# Patient Record
Sex: Female | Born: 1950 | Race: White | Hispanic: No | Smoking: Never smoker
Health system: Southern US, Community
[De-identification: ages and names within clinical notes are randomized; demographics above are authoritative.]

## PROBLEM LIST (undated history)

## (undated) DIAGNOSIS — F32A Depression, unspecified: Secondary | ICD-10-CM

## (undated) DIAGNOSIS — I10 Essential (primary) hypertension: Secondary | ICD-10-CM

## (undated) DIAGNOSIS — M8580 Other specified disorders of bone density and structure, unspecified site: Secondary | ICD-10-CM

## (undated) DIAGNOSIS — F329 Major depressive disorder, single episode, unspecified: Secondary | ICD-10-CM

## (undated) DIAGNOSIS — F419 Anxiety disorder, unspecified: Secondary | ICD-10-CM

## (undated) DIAGNOSIS — E039 Hypothyroidism, unspecified: Secondary | ICD-10-CM

## (undated) HISTORY — PX: OTHER SURGICAL HISTORY: SHX169

## (undated) HISTORY — DX: Depression, unspecified: F32.A

## (undated) HISTORY — DX: Major depressive disorder, single episode, unspecified: F32.9

## (undated) HISTORY — PX: TEMPOROMANDIBULAR JOINT SURGERY: SHX35

## (undated) HISTORY — DX: Anxiety disorder, unspecified: F41.9

## (undated) HISTORY — DX: Essential (primary) hypertension: I10

## (undated) HISTORY — DX: Other specified disorders of bone density and structure, unspecified site: M85.80

## (undated) HISTORY — DX: Hypothyroidism, unspecified: E03.9

---

## 2008-02-13 HISTORY — PX: COLONOSCOPY: SHX174

## 2008-08-18 ENCOUNTER — Ambulatory Visit (HOSPITAL_COMMUNITY): Admission: RE | Admit: 2008-08-18 | Discharge: 2008-08-18 | Payer: Self-pay | Admitting: Family Medicine

## 2009-04-04 ENCOUNTER — Ambulatory Visit (HOSPITAL_COMMUNITY): Admission: RE | Admit: 2009-04-04 | Discharge: 2009-04-04 | Payer: Self-pay | Admitting: Family Medicine

## 2009-04-13 ENCOUNTER — Ambulatory Visit (HOSPITAL_COMMUNITY): Admission: RE | Admit: 2009-04-13 | Discharge: 2009-04-13 | Payer: Self-pay | Admitting: Family Medicine

## 2009-12-08 ENCOUNTER — Ambulatory Visit (HOSPITAL_COMMUNITY): Admission: RE | Admit: 2009-12-08 | Discharge: 2009-12-08 | Payer: Self-pay | Admitting: Family Medicine

## 2009-12-08 ENCOUNTER — Encounter: Payer: Self-pay | Admitting: Orthopedic Surgery

## 2009-12-14 ENCOUNTER — Ambulatory Visit: Payer: Self-pay | Admitting: Orthopedic Surgery

## 2009-12-14 DIAGNOSIS — M20019 Mallet finger of unspecified finger(s): Secondary | ICD-10-CM | POA: Insufficient documentation

## 2009-12-19 ENCOUNTER — Encounter (HOSPITAL_COMMUNITY)
Admission: RE | Admit: 2009-12-19 | Discharge: 2010-01-18 | Payer: Self-pay | Source: Home / Self Care | Admitting: Orthopedic Surgery

## 2010-01-11 ENCOUNTER — Encounter: Payer: Self-pay | Admitting: Orthopedic Surgery

## 2010-01-20 ENCOUNTER — Ambulatory Visit (HOSPITAL_COMMUNITY)
Admission: RE | Admit: 2010-01-20 | Discharge: 2010-01-20 | Payer: Self-pay | Source: Home / Self Care | Attending: Family Medicine | Admitting: Family Medicine

## 2010-01-25 ENCOUNTER — Ambulatory Visit: Payer: Self-pay | Admitting: Orthopedic Surgery

## 2010-02-01 ENCOUNTER — Ambulatory Visit: Payer: Self-pay | Admitting: Orthopedic Surgery

## 2010-02-23 ENCOUNTER — Encounter (HOSPITAL_COMMUNITY)
Admission: RE | Admit: 2010-02-23 | Discharge: 2010-03-14 | Payer: Self-pay | Source: Home / Self Care | Attending: Orthopedic Surgery | Admitting: Orthopedic Surgery

## 2010-03-06 ENCOUNTER — Encounter: Payer: Self-pay | Admitting: Family Medicine

## 2010-03-08 ENCOUNTER — Ambulatory Visit: Admit: 2010-03-08 | Payer: Self-pay | Admitting: Orthopedic Surgery

## 2010-03-14 NOTE — Letter (Signed)
Summary: History Form  History Form   Imported By: Jacklynn Ganong 12/19/2009 11:16:04  _____________________________________________________________________  External Attachment:    Type:   Image     Comment:   External Document

## 2010-03-14 NOTE — Assessment & Plan Note (Signed)
Summary: EVAL/TREAT FX RT PINKY FINGER/XRAY RDC/REF DR MCGOUGH/AETNA/CAF   Visit Type:  Initial Consult Referring Provider:  Dr. Regino Schultze  CC:  right little finger fracture.  History of Present Illness: I saw Lori Meadows in the office today for an initial visit.  She is a 60 years old woman with the complaint of:  right little finger fracture.  DOI ???  Xrays at [facility] on 12-08-09.  Medications: Synthroid, Fosamax, Zanax.  This is a 60 year old female, who injured her small RIGHT small finger in a tail gating, accident and self treated with a splint for 2 weeks, then she took the splint off. She had a drop finger at the DIP joint. She saw her primary care physician complaining of inability to extend the finger and dull pain, which was 3/10 associated with swelling.    Allergies (verified): 1)  ! * Lexapro  Past History:  Past Medical History: Glaucoma Anxiety Depression Hypothyroid Osteopenia  Review of Systems Constitutional:  Complains of fatigue; denies weight loss, weight gain, fever, and chills. Cardiovascular:  Complains of palpitations; denies chest pain, fainting, and murmurs. Respiratory:  Complains of wheezing; denies short of breath, couch, tightness, pain on inspiration, and snoring . Gastrointestinal:  Complains of heartburn; denies nausea, vomiting, diarrhea, constipation, and blood in your stools. Genitourinary:  Complains of frequency; denies urgency, difficulty urinating, painful urination, flank pain, and bleeding in urine. Neurologic:  Complains of numbness; denies tingling, unsteady gait, dizziness, tremors, and seizure. Musculoskeletal:  Complains of joint pain; denies swelling, instability, stiffness, redness, heat, and muscle pain. Endocrine:  Complains of heat or cold intolerance; denies excessive thirst and exessive urination. Psychiatric:  Complains of nervousness, depression, and anxiety; denies hallucinations. Skin:  Denies changes in the skin,  poor healing, rash, itching, and redness. HEENT:  Denies blurred or double vision, eye pain, redness, and watering. Immunology:  Denies seasonal allergies, sinus problems, and allergic to bee stings. Hemoatologic:  Complains of brusing; denies easy bleeding.  Physical Exam  Skin:  intact without lesions or rashes Psych:  alert and cooperative; normal mood and affect; normal attention span and concentration   Wrist/Hand Exam  General:    Well-developed, well-nourished, in no acute distress; alert and oriented x 3.    Skin:    Intact with no erythema; no scarring.    Inspection:    swelling of the DIP joint of the RIGHT hand with tenderness and a little redness. Some tenderness and pain at the PIP joint as well  Palpation:    as noted  Vascular:    normal capillary refill in the digit  Sensory:    normal sensation in the digit  Motor:    extension weakness at the DIP joint  Reflexes:    not tested  Hand Exam:    Right:    Inspection:  Abnormal    Palpation:  Abnormal    Tenderness:  5th DIPJ    Swelling:  5th DIPJ    loss of extension of the DIP joint   Impression & Recommendations:  Problem # 1:  MALLET FINGER (ICD-736.1) Assessment New x-rays are obtained from the hospital via the computer. They show a small avulsion fracture at the DIP joint and involving the proximal phalanx with a mallet deformity.  Recommend hand in rehabilitation mallet finger protocol with splinting for 6 weeks full time and then 4 weeks part-time Orders: Physical Therapy Referral (PT) New Patient Level II (16109)  Patient Instructions: 1)  come back in 6 weeks for  recheck on mallet finger with xrays. 2)  Please call [facility] to get you appt for making of the splint for your finger.   Orders Added: 1)  Physical Therapy Referral [PT] 2)  New Patient Level II [13244]

## 2010-03-14 NOTE — Miscellaneous (Signed)
Summary: OT Clinical evaluation  OT Clinical evaluation   Imported By: Jacklynn Ganong 01/12/2010 08:43:45  _____________________________________________________________________  External Attachment:    Type:   Image     Comment:   External Document

## 2010-03-16 NOTE — Assessment & Plan Note (Signed)
Summary: 6 WK RECK/XR RT PINKY FINGER/AETNE/BSF   Visit Type:  Follow-up Referring Provider:  Dr. Regino Schultze  CC:  fx care right pinky.  History of Present Illness:  Medications: Synthroid, Fosamax, Zanax.  This is a 60 year old female, who injured her small RIGHT small finger in a tail gating, accident and self treated with a splint for 2 weeks, then she took the splint off. She had a drop finger at the DIP joint. She saw her primary care physician complaining of inability to extend the finger and dull pain, which was 3/10 associated with swelling.  Today is 6 week recheck with xrays after splinting   [facility] made the splint   Followup treatment after delayed presentation for mallet finger RIGHT small finger  Followup x-rays after 6 weeks of splinting show that the fracture has healed  Clinically with the splint off the finger is in extension against gravity  Recommend weaning process with 4 weeks of nighttime.  weaning process during the day         Allergies: 1)  ! * Lexapro   Impression & Recommendations:  Problem # 1:  MALLET FINGER (ICD-736.1) [facility] lateral and oblique x-rays RIGHT small finger show the fracture has healed in extension.  Impression healed fracture distal phalanx there Orders: Occupational Therapy (OT) Est. Patient Level II (04540) Finger(s) x-ray, minimum 2 views (98119)  Patient Instructions: 1)  OT  2)  for weaning program for mallet finger  3)  continue splint 4)  come back in 4 weeks   Orders Added: 1)  Occupational Therapy [OT] 2)  Est. Patient Level II [14782] 3)  Finger(s) x-ray, minimum 2 views [73140]

## 2010-03-21 ENCOUNTER — Encounter: Payer: Self-pay | Admitting: Orthopedic Surgery

## 2010-03-30 NOTE — Miscellaneous (Signed)
Summary: OT clinical eval  OT clinical eval   Imported By: Jacklynn Ganong 03/23/2010 10:05:39  _____________________________________________________________________  External Attachment:    Type:   Image     Comment:   External Document

## 2010-04-12 ENCOUNTER — Encounter: Payer: Self-pay | Admitting: Orthopedic Surgery

## 2010-04-25 NOTE — Miscellaneous (Signed)
Summary: Rehab no show  Rehab no show   Imported By: Jacklynn Ganong 04/19/2010 11:28:14  _____________________________________________________________________  External Attachment:    Type:   Image     Comment:   External Document

## 2010-10-26 ENCOUNTER — Other Ambulatory Visit (HOSPITAL_COMMUNITY): Payer: Self-pay | Admitting: Family Medicine

## 2010-10-26 DIAGNOSIS — Z139 Encounter for screening, unspecified: Secondary | ICD-10-CM

## 2010-10-31 ENCOUNTER — Ambulatory Visit (HOSPITAL_COMMUNITY): Payer: Managed Care, Other (non HMO)

## 2011-02-08 ENCOUNTER — Encounter: Payer: Self-pay | Admitting: General Practice

## 2011-02-19 ENCOUNTER — Ambulatory Visit: Payer: Managed Care, Other (non HMO) | Admitting: Urgent Care

## 2011-02-20 ENCOUNTER — Ambulatory Visit: Payer: Managed Care, Other (non HMO) | Admitting: Urgent Care

## 2011-02-28 ENCOUNTER — Ambulatory Visit (INDEPENDENT_AMBULATORY_CARE_PROVIDER_SITE_OTHER): Payer: Managed Care, Other (non HMO) | Admitting: Gastroenterology

## 2011-02-28 ENCOUNTER — Encounter: Payer: Self-pay | Admitting: Gastroenterology

## 2011-02-28 VITALS — BP 110/60 | HR 75 | Temp 98.8°F | Ht 65.0 in | Wt 150.8 lb

## 2011-02-28 DIAGNOSIS — R1314 Dysphagia, pharyngoesophageal phase: Secondary | ICD-10-CM

## 2011-02-28 DIAGNOSIS — R1310 Dysphagia, unspecified: Secondary | ICD-10-CM | POA: Insufficient documentation

## 2011-02-28 DIAGNOSIS — R1319 Other dysphagia: Secondary | ICD-10-CM | POA: Insufficient documentation

## 2011-02-28 NOTE — Progress Notes (Signed)
Faxed to PCP

## 2011-02-28 NOTE — Progress Notes (Signed)
Primary Care Physician:  Kirk Ruths, MD, MD  Primary Gastroenterologist:  Roetta Sessions, MD   Chief Complaint  Patient presents with  . Dysphagia    feels like she is choking    HPI:  Lori Meadows is a 61 y.o. female here for further evaluation of sensation of choking.  Choking problem since 09/2010. Feels like choking even without meal. Feels like pressure in chest. Pain there most of the time. Seems to be getting worse. Hard to get food or liquid down. Rare heartburn. No vomiting. No abdominal pain. BM regular. No melena, brbpr. Cannot eat anything dry, hard as it hurts and will not go down. Cut out meat. Eating soft foods mostly.   Thyroid medicine increased today.  Current Outpatient Prescriptions  Medication Sig Dispense Refill  . alendronate (FOSAMAX) 70 MG tablet Take 70 mg by mouth every 7 (seven) days. Take with a full glass of water on an empty stomach.      Marland Kitchen alprazolam (XANAX) 2 MG tablet Take 2 mg by mouth at bedtime as needed.      . latanoprost (XALATAN) 0.005 % ophthalmic solution Place 1 drop into both eyes at bedtime.      Marland Kitchen levothyroxine (SYNTHROID, LEVOTHROID) 25 MCG tablet Take 50 mcg by mouth daily.       Marland Kitchen lisinopril (PRINIVIL,ZESTRIL) 5 MG tablet Take 5 mg by mouth daily.        Allergies as of 02/28/2011 - Review Complete 02/28/2011  Allergen Reaction Noted  . Escitalopram oxalate      Past Medical History  Diagnosis Date  . Glaucoma   . Anxiety   . Depression   . Hypothyroid   . Osteopenia   . HTN (hypertension)   . Vitamin D deficiency     Past Surgical History  Procedure Date  . None   . Temporomandibular joint surgery     ???  . Colonoscopy 2010    Dr. Matthias Hughs, no polpys, next one done in 2020    Family History  Problem Relation Age of Onset  . Colon cancer Neg Hx   . Glaucoma Mother   . COPD Father     History   Social History  . Marital Status: Divorced    Spouse Name: N/A    Number of Children: 3  . Years of  Education: N/A   Occupational History  . retired     [employer]   Social History Main Topics  . Smoking status: Never Smoker   . Smokeless tobacco: Not on file  . Alcohol Use: No  . Drug Use: No  . Sexually Active: Not on file   Other Topics Concern  . Not on file   Social History Narrative  . No narrative on file      ROS:  General: Negative for anorexia, weight loss, fever, chills, fatigue, weakness. Eyes: Negative for vision changes.  ENT: Negative for hoarseness, nasal congestion.see history of present illness. CV: Negative for chest pain, angina, palpitations, dyspnea on exertion, peripheral edema.  Respiratory: Negative for dyspnea at rest, dyspnea on exertion, cough, sputum, wheezing.  GI: See history of present illness. GU:  Negative for dysuria, hematuria, urinary incontinence, urinary frequency, nocturnal urination.  MS: Negative for joint pain, low back pain.  Derm: Negative for rash or itching.  Neuro: Negative for weakness, abnormal sensation, seizure, frequent headaches, memory loss, confusion.  Psych: Negative for anxiety, depression, suicidal ideation, hallucinations.  Endo: Negative for unusual weight change.  Heme:  Negative for bruising or bleeding. Allergy: Negative for rash or hives.    Physical Examination:  BP 110/60  Pulse 75  Temp(Src) 98.8 F (37.1 C) (Temporal)  Ht 5\' 5"  (1.651 m)  Wt 150 lb 12.8 oz (68.402 kg)  BMI 25.09 kg/m2   General: Well-nourished, well-developed in no acute distress.  Head: Normocephalic, atraumatic.   Eyes: Conjunctiva pink, no icterus. Mouth: Oropharyngeal mucosa moist and pink , no lesions erythema or exudate. Neck: Supple without thyromegaly, masses, or lymphadenopathy.  Lungs: Clear to auscultation bilaterally.  Heart: Regular rate and rhythm, no murmurs rubs or gallops.  Abdomen: Bowel sounds are normal, nontender, nondistended, no hepatosplenomegaly or masses, no abdominal bruits or     hernia , no rebound or guarding.   Rectal: Not performed. Extremities: No lower extremity edema. No clubbing or deformities.  Neuro: Alert and oriented x 4 , grossly normal neurologically.  Skin: Warm and dry, no rash or jaundice.   Psych: Alert and cooperative, normal mood and affect.

## 2011-02-28 NOTE — Assessment & Plan Note (Signed)
Vague, kind of unusual, symptoms. Feels like something in her chest all the time for the past few months, gradually getting worse. However also having problems at times with solid foods such as bread, meat. She has adjusted her diet to a soft diet. Denies problem getting her pills down. Very careful with taking her Fosamax. Denies typical heartburn. I discussed with her today options of upper endoscopy. She is very concerned about not having a ride home although we did reassure her that we could arrange for transportation. The patient desires barium pill esophagram as first step. If negative, would consider CXR as next step to rule out extrinsic compression on the esophagus.

## 2011-02-28 NOTE — Patient Instructions (Signed)
We have scheduled you for a barium esophagram. We will call you with results.   Dysphagia Swallowing problems (dysphagia) occur when solids and liquids seem to stick in your throat on the way down to your stomach, or the food takes longer to get to the stomach. Other symptoms (problems) include regurgitating (burping) up food, noises coming from the throat, chest discomfort with swallowing, and a feeling of fullness in the throat when swallowing. When blockage in the throat is complete it may be associated with drooling. CAUSES There are many causes of swallowing difficulties and the following is generalized information regarding a number of reasons for this problem. Problems with swallowing may occur because of problems with the muscles. The food cannot be propelled in the usual manner into the stomach. There may be ulcers, scar tissueor inflammation (soreness) in the esophagus (the food tube from the mouth to the stomach) which blocks food from passing normally into the stomach. Causes of inflammation include acid reflux from the stomach into the esophagus. Inflammation can also be caused by the herpes simplex virus, Candida (yeast), radiation (as with treatment of cancer), or inflammation from medications not taken with adequate fluids to wash them down into the stomach. There may be nerve problems so signals cannot be sent adequately telling the muscles of the esophagus to contract and move the food along. Achalasia is a rare disorder of the esophagus in which muscular contractions of the esophagus are uncoordinated. Globus hystericus is a relatively common problem in young females in which there is a sense of an obstruction or difficulty in swallowing, but in which no abnormalities can be found. This problem usually improves over time with reassurance and testing to rule out other causes. DIAGNOSIS A number of tests will help your caregiver know what is the cause of your swallowing problems. These tests  may include a barium swallow in which x-rays are taken while you are drinking a liquid that outlines the lining of the esophagus on x-ray. If the stomach and small bowel are also studied in this manner it is called an upper gastrointestinal exam (UGI). Endoscopy may be done in which your caregiver examines your throat, esophagus, stomach and small bowel with an instrument like a small flexible telescope. Motility studies which measure the effectiveness and coordination of the muscular contractions of the esophagus may also be done. TREATMENT The treatment of swallowing problems are many, varying from medications to surgical treatment. The treatment varies with the type of problem found. Your caregiver will discuss your results and treatment with you. If swallowing problems are severe the long term problems which may occur include: malnutrition, pneumonia (from food going into the breathing tubes called trachea and bronchi), and an increase in tumors (lumps) of the esophagus. SEEK IMMEDIATE MEDICAL CARE IF:  Food or other object becomes lodged in your throat or esophagus and won't move.  Document Released: 01/27/2000 Document Revised: 10/11/2010 Document Reviewed: 09/17/2007 [facility] Patient Information 2012 [place], [place].

## 2011-03-05 ENCOUNTER — Telehealth: Payer: Self-pay | Admitting: Gastroenterology

## 2011-03-05 NOTE — Telephone Encounter (Signed)
Pt called back requesting we cancel her BPE- she stated that she did not want to have anything to do with [facility] - and would not have any procedure preformed there or by anyone associated with them- I tried to address her concerns but she insisted that she was going to go back to her PCP and be referred to a GI Dr in [facility] or [employer]- I told her to call us back as needed.

## 2011-03-06 NOTE — Telephone Encounter (Signed)
BPE Cancelled- Spoke w/ Misty Stanley in Radiology

## 2011-03-06 NOTE — Telephone Encounter (Signed)
Her choice.  

## 2011-03-06 NOTE — Telephone Encounter (Signed)
Is there any reason she has abruptly changed her attitude?

## 2011-03-06 NOTE — Telephone Encounter (Signed)
Per Cherene Julian, the patient stated she has heard bad things about [facility] and just doesn't want to be associated with any physician that practices at [facility].

## 2011-03-07 NOTE — Progress Notes (Signed)
REVIEWED.  

## 2011-03-08 ENCOUNTER — Ambulatory Visit (HOSPITAL_COMMUNITY): Payer: Managed Care, Other (non HMO)

## 2011-03-13 ENCOUNTER — Ambulatory Visit
Admission: RE | Admit: 2011-03-13 | Discharge: 2011-03-13 | Disposition: A | Discharge status: Home / Self Care | Payer: Managed Care, Other (non HMO) | Source: Ambulatory Visit | Attending: Otolaryngology | Admitting: Otolaryngology

## 2011-03-13 ENCOUNTER — Other Ambulatory Visit: Payer: Self-pay | Admitting: Otolaryngology

## 2011-04-30 IMAGING — CR DG CERVICAL SPINE COMPLETE 4+V
5 series · 5 of 5 positions shown · non-contrast
Comparison: None

CLINICAL DATA: Chronic neck pain, crepitus

CERVICAL SPINE - COMPLETE 4+ VIEW

[view not recorded (1 of 5)]
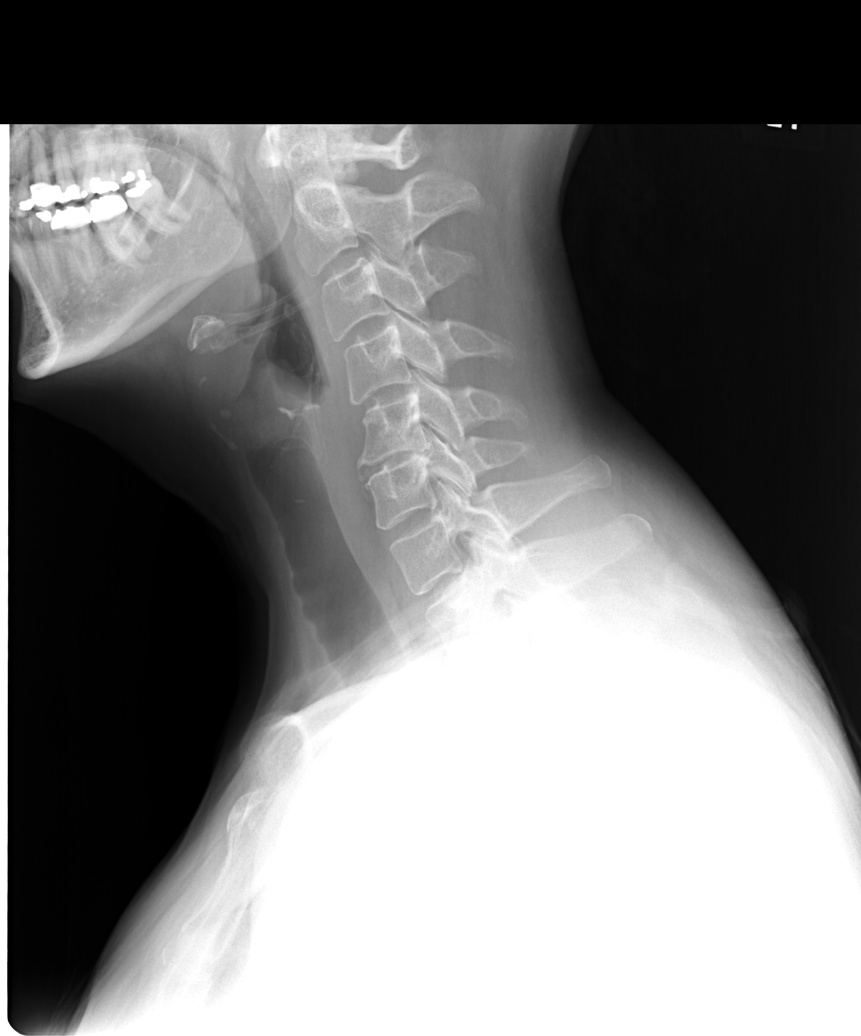

[view not recorded (2 of 5)]
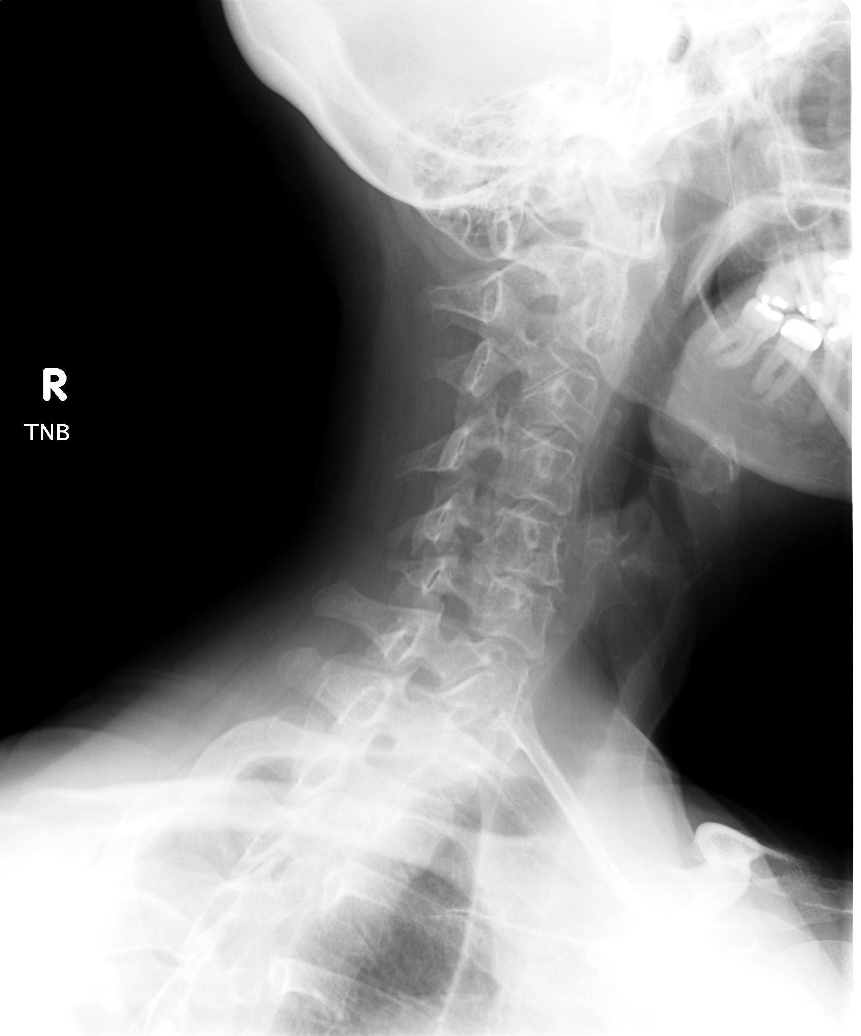

[view not recorded (3 of 5)]
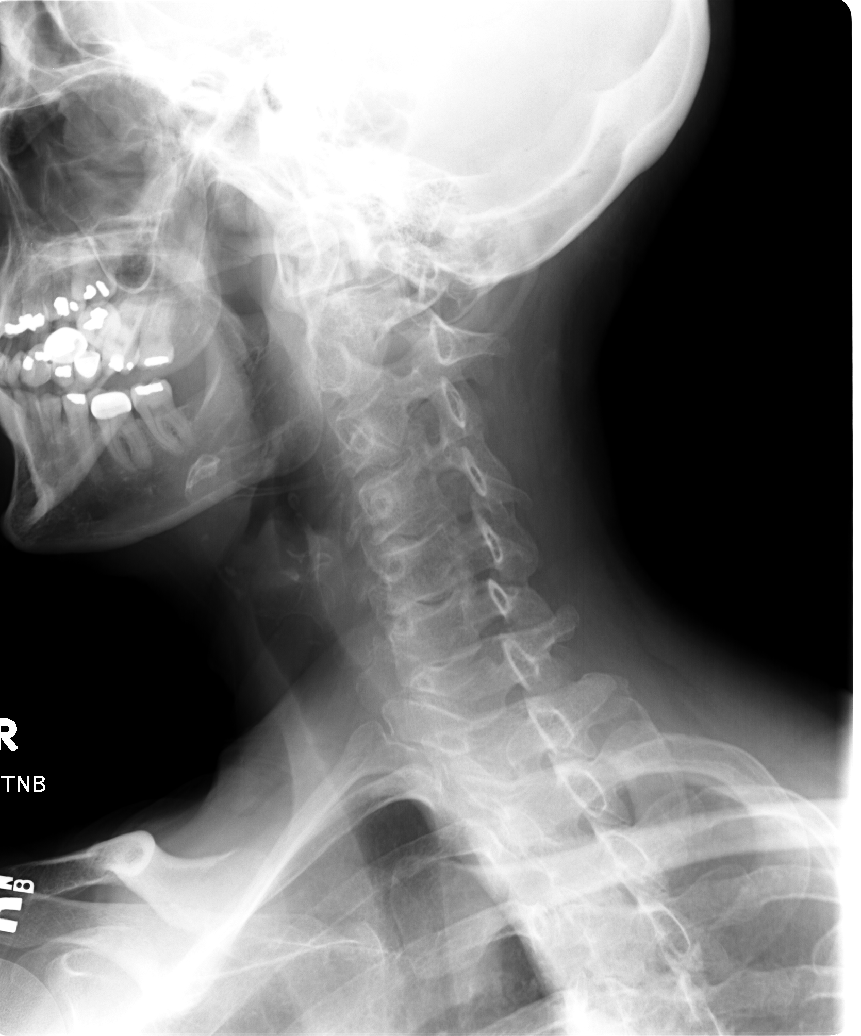

[view not recorded (4 of 5)]
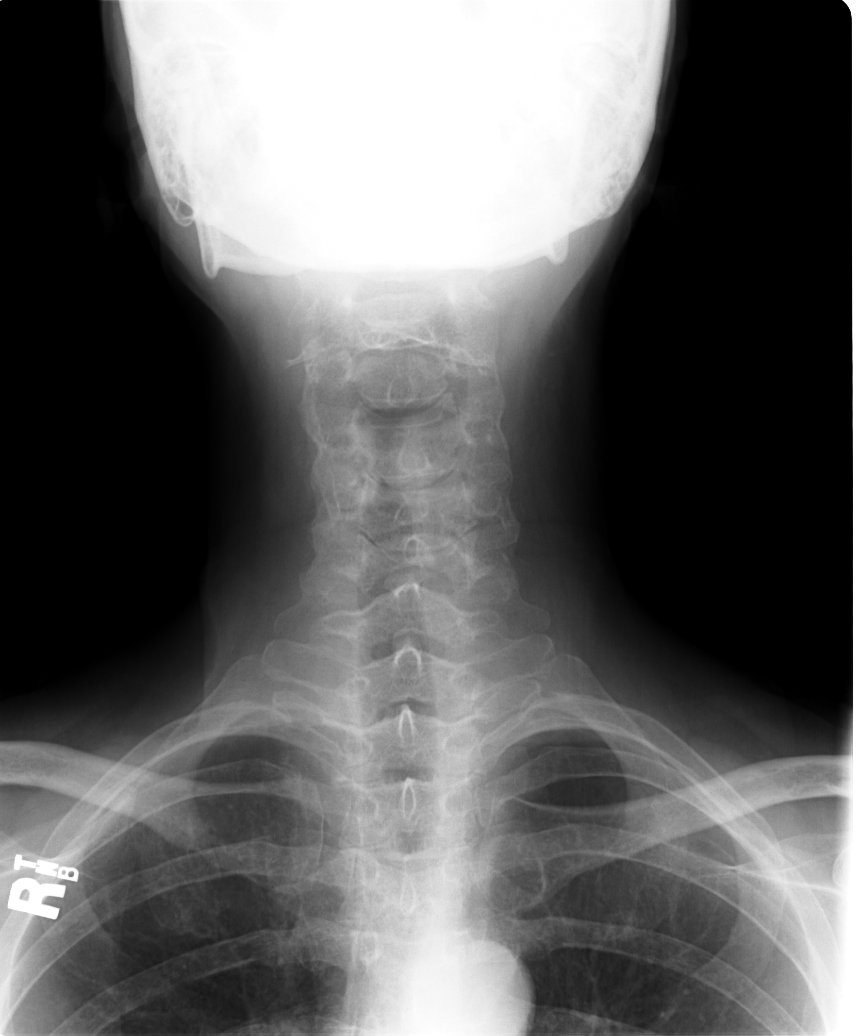

[view not recorded (5 of 5)]
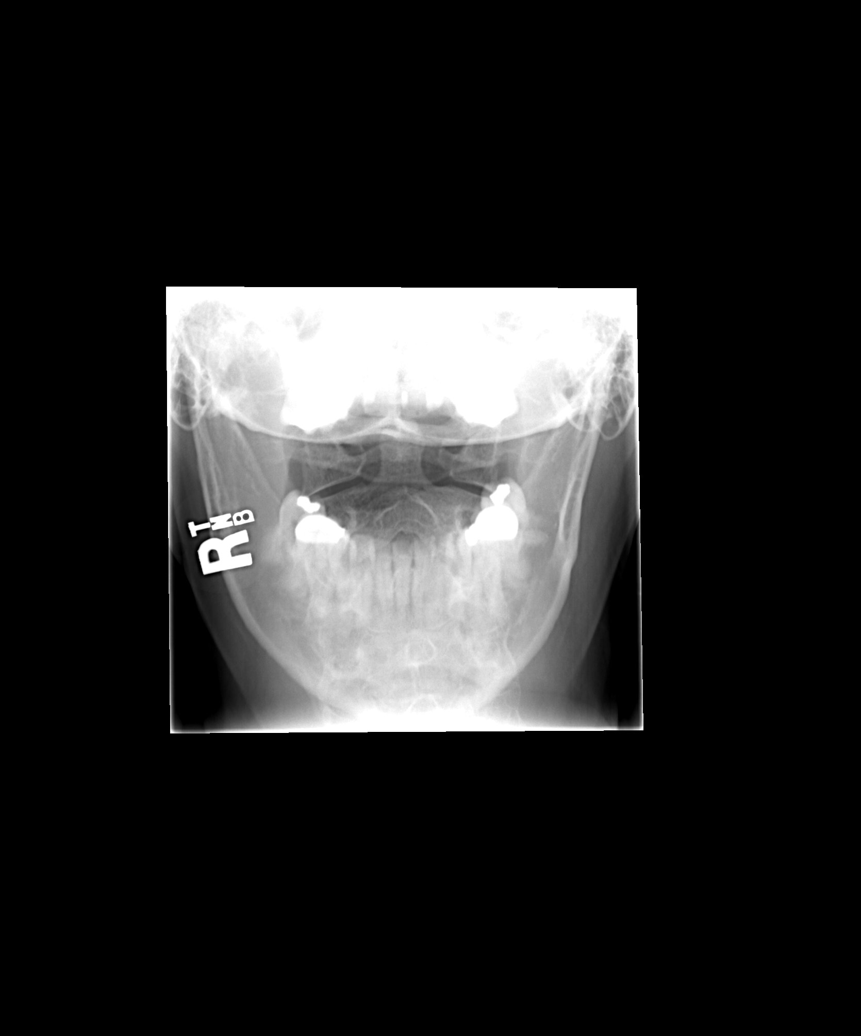

[5 of 5 positions shown; findings below may reference images not displayed]

FINDINGS: Diffuse bony demineralization.
Disc space narrowing C5-C6 with small anterior posterior spurs.
Minimal retrolisthesis C5-C6.
Reversal of cervical lordosis mid cervical spine.
Prevertebral soft tissues normal thickness.
Vertebral body heights maintained without fracture or additional
subluxation.
Slight encroachment upon right C5-C6 neural foramen by combination
of facet and uncovertebral hypertrophy.
Left neural foramen C5-C6 less narrowed.
C1-C2 alignment normal.
IMPRESSION: Degenerative disc disease changes of the cervical spine at C5-C6 as
above.
No acute bony abnormality.

## 2012-05-20 ENCOUNTER — Other Ambulatory Visit (HOSPITAL_COMMUNITY): Payer: Self-pay | Admitting: Family Medicine

## 2012-05-20 DIAGNOSIS — M810 Age-related osteoporosis without current pathological fracture: Secondary | ICD-10-CM

## 2012-05-22 ENCOUNTER — Ambulatory Visit (HOSPITAL_COMMUNITY)
Admission: RE | Admit: 2012-05-22 | Discharge: 2012-05-22 | Disposition: A | Discharge status: Home / Self Care | Payer: Medicare Other | Source: Ambulatory Visit | Attending: Family Medicine | Admitting: Family Medicine

## 2012-05-22 DIAGNOSIS — M810 Age-related osteoporosis without current pathological fracture: Secondary | ICD-10-CM

## 2012-05-22 DIAGNOSIS — M899 Disorder of bone, unspecified: Secondary | ICD-10-CM | POA: Insufficient documentation

## 2012-05-22 DIAGNOSIS — M949 Disorder of cartilage, unspecified: Secondary | ICD-10-CM | POA: Insufficient documentation

## 2015-06-17 ENCOUNTER — Other Ambulatory Visit (HOSPITAL_COMMUNITY): Payer: Self-pay | Admitting: Registered Nurse

## 2015-06-17 DIAGNOSIS — M810 Age-related osteoporosis without current pathological fracture: Secondary | ICD-10-CM

## 2015-06-24 ENCOUNTER — Ambulatory Visit (HOSPITAL_COMMUNITY)
Admission: RE | Admit: 2015-06-24 | Discharge: 2015-06-24 | Disposition: A | Discharge status: Home / Self Care | Payer: Commercial Managed Care - HMO | Source: Ambulatory Visit | Attending: Registered Nurse | Admitting: Registered Nurse

## 2015-06-24 DIAGNOSIS — M810 Age-related osteoporosis without current pathological fracture: Secondary | ICD-10-CM | POA: Diagnosis not present

## 2015-06-24 DIAGNOSIS — M85851 Other specified disorders of bone density and structure, right thigh: Secondary | ICD-10-CM | POA: Insufficient documentation

## 2015-06-24 DIAGNOSIS — M8588 Other specified disorders of bone density and structure, other site: Secondary | ICD-10-CM | POA: Diagnosis not present

## 2015-06-27 DIAGNOSIS — F419 Anxiety disorder, unspecified: Secondary | ICD-10-CM | POA: Diagnosis not present

## 2015-06-27 DIAGNOSIS — Z6823 Body mass index (BMI) 23.0-23.9, adult: Secondary | ICD-10-CM | POA: Diagnosis not present

## 2015-06-27 DIAGNOSIS — R5383 Other fatigue: Secondary | ICD-10-CM | POA: Diagnosis not present

## 2015-06-27 DIAGNOSIS — I10 Essential (primary) hypertension: Secondary | ICD-10-CM | POA: Diagnosis not present

## 2015-06-27 DIAGNOSIS — G894 Chronic pain syndrome: Secondary | ICD-10-CM | POA: Diagnosis not present

## 2015-06-27 DIAGNOSIS — Z1389 Encounter for screening for other disorder: Secondary | ICD-10-CM | POA: Diagnosis not present

## 2015-07-19 DIAGNOSIS — H2512 Age-related nuclear cataract, left eye: Secondary | ICD-10-CM | POA: Diagnosis not present

## 2015-07-19 DIAGNOSIS — H409 Unspecified glaucoma: Secondary | ICD-10-CM | POA: Diagnosis not present

## 2015-07-19 DIAGNOSIS — H2511 Age-related nuclear cataract, right eye: Secondary | ICD-10-CM | POA: Diagnosis not present

## 2015-07-19 DIAGNOSIS — H02839 Dermatochalasis of unspecified eye, unspecified eyelid: Secondary | ICD-10-CM | POA: Diagnosis not present

## 2015-09-27 DIAGNOSIS — Z23 Encounter for immunization: Secondary | ICD-10-CM | POA: Diagnosis not present

## 2015-09-27 DIAGNOSIS — G894 Chronic pain syndrome: Secondary | ICD-10-CM | POA: Diagnosis not present

## 2015-09-27 DIAGNOSIS — F419 Anxiety disorder, unspecified: Secondary | ICD-10-CM | POA: Diagnosis not present

## 2015-09-27 DIAGNOSIS — E063 Autoimmune thyroiditis: Secondary | ICD-10-CM | POA: Diagnosis not present

## 2015-09-27 DIAGNOSIS — Z6825 Body mass index (BMI) 25.0-25.9, adult: Secondary | ICD-10-CM | POA: Diagnosis not present

## 2015-10-06 DIAGNOSIS — H40003 Preglaucoma, unspecified, bilateral: Secondary | ICD-10-CM | POA: Diagnosis not present

## 2015-10-06 DIAGNOSIS — H409 Unspecified glaucoma: Secondary | ICD-10-CM | POA: Diagnosis not present

## 2015-10-06 DIAGNOSIS — H2513 Age-related nuclear cataract, bilateral: Secondary | ICD-10-CM | POA: Diagnosis not present

## 2015-10-06 DIAGNOSIS — H02839 Dermatochalasis of unspecified eye, unspecified eyelid: Secondary | ICD-10-CM | POA: Diagnosis not present

## 2016-03-08 DIAGNOSIS — H16223 Keratoconjunctivitis sicca, not specified as Sjogren's, bilateral: Secondary | ICD-10-CM | POA: Diagnosis not present

## 2016-03-08 DIAGNOSIS — H02839 Dermatochalasis of unspecified eye, unspecified eyelid: Secondary | ICD-10-CM | POA: Diagnosis not present

## 2016-03-08 DIAGNOSIS — H401131 Primary open-angle glaucoma, bilateral, mild stage: Secondary | ICD-10-CM | POA: Diagnosis not present

## 2016-03-30 DIAGNOSIS — F419 Anxiety disorder, unspecified: Secondary | ICD-10-CM | POA: Diagnosis not present

## 2016-03-30 DIAGNOSIS — E063 Autoimmune thyroiditis: Secondary | ICD-10-CM | POA: Diagnosis not present

## 2016-03-30 DIAGNOSIS — Z1389 Encounter for screening for other disorder: Secondary | ICD-10-CM | POA: Diagnosis not present

## 2016-03-30 DIAGNOSIS — E663 Overweight: Secondary | ICD-10-CM | POA: Diagnosis not present

## 2016-03-30 DIAGNOSIS — I10 Essential (primary) hypertension: Secondary | ICD-10-CM | POA: Diagnosis not present

## 2016-03-30 DIAGNOSIS — Z23 Encounter for immunization: Secondary | ICD-10-CM | POA: Diagnosis not present

## 2016-03-30 DIAGNOSIS — Z6826 Body mass index (BMI) 26.0-26.9, adult: Secondary | ICD-10-CM | POA: Diagnosis not present

## 2016-04-02 DIAGNOSIS — Z1231 Encounter for screening mammogram for malignant neoplasm of breast: Secondary | ICD-10-CM | POA: Diagnosis not present

## 2016-04-02 DIAGNOSIS — Z6825 Body mass index (BMI) 25.0-25.9, adult: Secondary | ICD-10-CM | POA: Diagnosis not present

## 2016-04-02 DIAGNOSIS — Z01419 Encounter for gynecological examination (general) (routine) without abnormal findings: Secondary | ICD-10-CM | POA: Diagnosis not present

## 2016-06-07 DIAGNOSIS — H43813 Vitreous degeneration, bilateral: Secondary | ICD-10-CM | POA: Diagnosis not present

## 2016-06-07 DIAGNOSIS — H2513 Age-related nuclear cataract, bilateral: Secondary | ICD-10-CM | POA: Diagnosis not present

## 2016-06-07 DIAGNOSIS — H401131 Primary open-angle glaucoma, bilateral, mild stage: Secondary | ICD-10-CM | POA: Diagnosis not present

## 2016-06-27 DIAGNOSIS — Z1389 Encounter for screening for other disorder: Secondary | ICD-10-CM | POA: Diagnosis not present

## 2016-06-27 DIAGNOSIS — I10 Essential (primary) hypertension: Secondary | ICD-10-CM | POA: Diagnosis not present

## 2016-06-27 DIAGNOSIS — Z6826 Body mass index (BMI) 26.0-26.9, adult: Secondary | ICD-10-CM | POA: Diagnosis not present

## 2016-06-27 DIAGNOSIS — E782 Mixed hyperlipidemia: Secondary | ICD-10-CM | POA: Diagnosis not present

## 2016-06-27 DIAGNOSIS — E039 Hypothyroidism, unspecified: Secondary | ICD-10-CM | POA: Diagnosis not present

## 2016-06-27 DIAGNOSIS — Z0000 Encounter for general adult medical examination without abnormal findings: Secondary | ICD-10-CM | POA: Diagnosis not present

## 2016-06-27 DIAGNOSIS — E063 Autoimmune thyroiditis: Secondary | ICD-10-CM | POA: Diagnosis not present

## 2016-07-26 DIAGNOSIS — M85851 Other specified disorders of bone density and structure, right thigh: Secondary | ICD-10-CM | POA: Diagnosis not present

## 2016-07-26 DIAGNOSIS — Z79899 Other long term (current) drug therapy: Secondary | ICD-10-CM | POA: Diagnosis not present

## 2016-07-26 DIAGNOSIS — M85852 Other specified disorders of bone density and structure, left thigh: Secondary | ICD-10-CM | POA: Diagnosis not present

## 2016-07-26 DIAGNOSIS — K219 Gastro-esophageal reflux disease without esophagitis: Secondary | ICD-10-CM | POA: Diagnosis not present

## 2016-07-26 DIAGNOSIS — Z780 Asymptomatic menopausal state: Secondary | ICD-10-CM | POA: Diagnosis not present

## 2016-07-26 DIAGNOSIS — E7800 Pure hypercholesterolemia, unspecified: Secondary | ICD-10-CM | POA: Diagnosis not present

## 2016-07-26 DIAGNOSIS — E039 Hypothyroidism, unspecified: Secondary | ICD-10-CM | POA: Diagnosis not present

## 2016-07-26 DIAGNOSIS — E2839 Other primary ovarian failure: Secondary | ICD-10-CM | POA: Diagnosis not present

## 2016-10-24 DIAGNOSIS — Z6826 Body mass index (BMI) 26.0-26.9, adult: Secondary | ICD-10-CM | POA: Diagnosis not present

## 2016-10-24 DIAGNOSIS — I10 Essential (primary) hypertension: Secondary | ICD-10-CM | POA: Diagnosis not present

## 2016-10-24 DIAGNOSIS — E063 Autoimmune thyroiditis: Secondary | ICD-10-CM | POA: Diagnosis not present

## 2016-10-24 DIAGNOSIS — E663 Overweight: Secondary | ICD-10-CM | POA: Diagnosis not present

## 2016-10-24 DIAGNOSIS — Z1389 Encounter for screening for other disorder: Secondary | ICD-10-CM | POA: Diagnosis not present

## 2016-10-24 DIAGNOSIS — F419 Anxiety disorder, unspecified: Secondary | ICD-10-CM | POA: Diagnosis not present

## 2016-10-24 DIAGNOSIS — B354 Tinea corporis: Secondary | ICD-10-CM | POA: Diagnosis not present

## 2016-12-31 DIAGNOSIS — H40013 Open angle with borderline findings, low risk, bilateral: Secondary | ICD-10-CM | POA: Diagnosis not present

## 2017-01-25 DIAGNOSIS — F419 Anxiety disorder, unspecified: Secondary | ICD-10-CM | POA: Diagnosis not present

## 2017-01-25 DIAGNOSIS — Z23 Encounter for immunization: Secondary | ICD-10-CM | POA: Diagnosis not present

## 2017-01-25 DIAGNOSIS — Z6828 Body mass index (BMI) 28.0-28.9, adult: Secondary | ICD-10-CM | POA: Diagnosis not present

## 2017-01-25 DIAGNOSIS — E663 Overweight: Secondary | ICD-10-CM | POA: Diagnosis not present

## 2017-03-28 DIAGNOSIS — C44519 Basal cell carcinoma of skin of other part of trunk: Secondary | ICD-10-CM | POA: Diagnosis not present

## 2017-03-28 DIAGNOSIS — L821 Other seborrheic keratosis: Secondary | ICD-10-CM | POA: Diagnosis not present

## 2017-03-28 DIAGNOSIS — L820 Inflamed seborrheic keratosis: Secondary | ICD-10-CM | POA: Diagnosis not present

## 2017-03-28 DIAGNOSIS — L570 Actinic keratosis: Secondary | ICD-10-CM | POA: Diagnosis not present

## 2017-03-28 DIAGNOSIS — D225 Melanocytic nevi of trunk: Secondary | ICD-10-CM | POA: Diagnosis not present

## 2017-03-28 DIAGNOSIS — D485 Neoplasm of uncertain behavior of skin: Secondary | ICD-10-CM | POA: Diagnosis not present

## 2017-03-28 DIAGNOSIS — L918 Other hypertrophic disorders of the skin: Secondary | ICD-10-CM | POA: Diagnosis not present

## 2017-04-04 DIAGNOSIS — Z1231 Encounter for screening mammogram for malignant neoplasm of breast: Secondary | ICD-10-CM | POA: Diagnosis not present

## 2017-04-04 DIAGNOSIS — Z124 Encounter for screening for malignant neoplasm of cervix: Secondary | ICD-10-CM | POA: Diagnosis not present

## 2017-04-04 DIAGNOSIS — Z6829 Body mass index (BMI) 29.0-29.9, adult: Secondary | ICD-10-CM | POA: Diagnosis not present

## 2017-04-04 DIAGNOSIS — R635 Abnormal weight gain: Secondary | ICD-10-CM | POA: Diagnosis not present

## 2017-05-01 DIAGNOSIS — F419 Anxiety disorder, unspecified: Secondary | ICD-10-CM | POA: Diagnosis not present

## 2017-05-01 DIAGNOSIS — Z6829 Body mass index (BMI) 29.0-29.9, adult: Secondary | ICD-10-CM | POA: Diagnosis not present

## 2017-05-01 DIAGNOSIS — E663 Overweight: Secondary | ICD-10-CM | POA: Diagnosis not present

## 2017-05-01 DIAGNOSIS — Z1389 Encounter for screening for other disorder: Secondary | ICD-10-CM | POA: Diagnosis not present

## 2017-07-01 DIAGNOSIS — H401131 Primary open-angle glaucoma, bilateral, mild stage: Secondary | ICD-10-CM | POA: Diagnosis not present

## 2017-07-29 DIAGNOSIS — F419 Anxiety disorder, unspecified: Secondary | ICD-10-CM | POA: Diagnosis not present

## 2017-07-29 DIAGNOSIS — E663 Overweight: Secondary | ICD-10-CM | POA: Diagnosis not present

## 2017-07-29 DIAGNOSIS — Z0001 Encounter for general adult medical examination with abnormal findings: Secondary | ICD-10-CM | POA: Diagnosis not present

## 2017-07-29 DIAGNOSIS — Z6827 Body mass index (BMI) 27.0-27.9, adult: Secondary | ICD-10-CM | POA: Diagnosis not present

## 2017-07-29 DIAGNOSIS — F329 Major depressive disorder, single episode, unspecified: Secondary | ICD-10-CM | POA: Diagnosis not present

## 2017-07-29 DIAGNOSIS — Z1389 Encounter for screening for other disorder: Secondary | ICD-10-CM | POA: Diagnosis not present

## 2017-07-29 DIAGNOSIS — E782 Mixed hyperlipidemia: Secondary | ICD-10-CM | POA: Diagnosis not present

## 2017-07-29 DIAGNOSIS — E063 Autoimmune thyroiditis: Secondary | ICD-10-CM | POA: Diagnosis not present

## 2017-07-29 DIAGNOSIS — E7849 Other hyperlipidemia: Secondary | ICD-10-CM | POA: Diagnosis not present

## 2017-07-29 DIAGNOSIS — I10 Essential (primary) hypertension: Secondary | ICD-10-CM | POA: Diagnosis not present

## 2017-07-30 ENCOUNTER — Other Ambulatory Visit (HOSPITAL_COMMUNITY): Payer: Self-pay | Admitting: Internal Medicine

## 2017-07-30 DIAGNOSIS — E2839 Other primary ovarian failure: Secondary | ICD-10-CM

## 2017-11-06 DIAGNOSIS — E063 Autoimmune thyroiditis: Secondary | ICD-10-CM | POA: Diagnosis not present

## 2017-11-06 DIAGNOSIS — Z1389 Encounter for screening for other disorder: Secondary | ICD-10-CM | POA: Diagnosis not present

## 2017-11-06 DIAGNOSIS — Z23 Encounter for immunization: Secondary | ICD-10-CM | POA: Diagnosis not present

## 2017-11-06 DIAGNOSIS — Z6828 Body mass index (BMI) 28.0-28.9, adult: Secondary | ICD-10-CM | POA: Diagnosis not present

## 2017-11-06 DIAGNOSIS — F419 Anxiety disorder, unspecified: Secondary | ICD-10-CM | POA: Diagnosis not present

## 2017-11-06 DIAGNOSIS — I10 Essential (primary) hypertension: Secondary | ICD-10-CM | POA: Diagnosis not present

## 2017-12-16 DIAGNOSIS — H401131 Primary open-angle glaucoma, bilateral, mild stage: Secondary | ICD-10-CM | POA: Diagnosis not present

## 2018-03-13 DIAGNOSIS — Z6829 Body mass index (BMI) 29.0-29.9, adult: Secondary | ICD-10-CM | POA: Diagnosis not present

## 2018-03-13 DIAGNOSIS — F329 Major depressive disorder, single episode, unspecified: Secondary | ICD-10-CM | POA: Diagnosis not present

## 2018-03-13 DIAGNOSIS — Z23 Encounter for immunization: Secondary | ICD-10-CM | POA: Diagnosis not present

## 2018-03-13 DIAGNOSIS — I10 Essential (primary) hypertension: Secondary | ICD-10-CM | POA: Diagnosis not present

## 2018-03-13 DIAGNOSIS — G894 Chronic pain syndrome: Secondary | ICD-10-CM | POA: Diagnosis not present

## 2018-03-13 DIAGNOSIS — J309 Allergic rhinitis, unspecified: Secondary | ICD-10-CM | POA: Diagnosis not present

## 2018-03-13 DIAGNOSIS — Z0001 Encounter for general adult medical examination with abnormal findings: Secondary | ICD-10-CM | POA: Diagnosis not present

## 2018-03-13 DIAGNOSIS — F419 Anxiety disorder, unspecified: Secondary | ICD-10-CM | POA: Diagnosis not present

## 2018-03-13 DIAGNOSIS — Z1389 Encounter for screening for other disorder: Secondary | ICD-10-CM | POA: Diagnosis not present

## 2018-03-27 DIAGNOSIS — D225 Melanocytic nevi of trunk: Secondary | ICD-10-CM | POA: Diagnosis not present

## 2018-03-27 DIAGNOSIS — D2271 Melanocytic nevi of right lower limb, including hip: Secondary | ICD-10-CM | POA: Diagnosis not present

## 2018-03-27 DIAGNOSIS — L821 Other seborrheic keratosis: Secondary | ICD-10-CM | POA: Diagnosis not present

## 2018-03-27 DIAGNOSIS — Z85828 Personal history of other malignant neoplasm of skin: Secondary | ICD-10-CM | POA: Diagnosis not present

## 2018-03-27 DIAGNOSIS — L570 Actinic keratosis: Secondary | ICD-10-CM | POA: Diagnosis not present

## 2018-03-27 DIAGNOSIS — D1801 Hemangioma of skin and subcutaneous tissue: Secondary | ICD-10-CM | POA: Diagnosis not present

## 2018-03-27 DIAGNOSIS — D2272 Melanocytic nevi of left lower limb, including hip: Secondary | ICD-10-CM | POA: Diagnosis not present

## 2018-03-27 DIAGNOSIS — L438 Other lichen planus: Secondary | ICD-10-CM | POA: Diagnosis not present

## 2018-04-07 DIAGNOSIS — Z1231 Encounter for screening mammogram for malignant neoplasm of breast: Secondary | ICD-10-CM | POA: Diagnosis not present

## 2018-04-07 DIAGNOSIS — Z6828 Body mass index (BMI) 28.0-28.9, adult: Secondary | ICD-10-CM | POA: Diagnosis not present

## 2018-04-07 DIAGNOSIS — Z124 Encounter for screening for malignant neoplasm of cervix: Secondary | ICD-10-CM | POA: Diagnosis not present

## 2018-06-27 DIAGNOSIS — F419 Anxiety disorder, unspecified: Secondary | ICD-10-CM | POA: Diagnosis not present

## 2018-06-27 DIAGNOSIS — I10 Essential (primary) hypertension: Secondary | ICD-10-CM | POA: Diagnosis not present

## 2018-06-27 DIAGNOSIS — M810 Age-related osteoporosis without current pathological fracture: Secondary | ICD-10-CM | POA: Diagnosis not present

## 2018-06-27 DIAGNOSIS — Z23 Encounter for immunization: Secondary | ICD-10-CM | POA: Diagnosis not present

## 2018-06-27 DIAGNOSIS — Z6826 Body mass index (BMI) 26.0-26.9, adult: Secondary | ICD-10-CM | POA: Diagnosis not present

## 2018-07-04 DIAGNOSIS — Z23 Encounter for immunization: Secondary | ICD-10-CM | POA: Diagnosis not present

## 2018-08-21 ENCOUNTER — Other Ambulatory Visit: Payer: Self-pay

## 2018-08-21 ENCOUNTER — Other Ambulatory Visit: Payer: Medicare HMO

## 2018-08-21 DIAGNOSIS — R6889 Other general symptoms and signs: Secondary | ICD-10-CM | POA: Diagnosis not present

## 2018-08-21 DIAGNOSIS — Z20822 Contact with and (suspected) exposure to covid-19: Secondary | ICD-10-CM

## 2018-08-25 LAB — NOVEL CORONAVIRUS, NAA: SARS-CoV-2, NAA: NOT DETECTED

## 2018-10-10 DIAGNOSIS — Z6826 Body mass index (BMI) 26.0-26.9, adult: Secondary | ICD-10-CM | POA: Diagnosis not present

## 2018-10-10 DIAGNOSIS — I10 Essential (primary) hypertension: Secondary | ICD-10-CM | POA: Diagnosis not present

## 2018-10-10 DIAGNOSIS — E663 Overweight: Secondary | ICD-10-CM | POA: Diagnosis not present

## 2018-10-10 DIAGNOSIS — K219 Gastro-esophageal reflux disease without esophagitis: Secondary | ICD-10-CM | POA: Diagnosis not present

## 2018-10-10 DIAGNOSIS — F419 Anxiety disorder, unspecified: Secondary | ICD-10-CM | POA: Diagnosis not present

## 2018-11-25 DIAGNOSIS — Z23 Encounter for immunization: Secondary | ICD-10-CM | POA: Diagnosis not present

## 2018-12-19 DIAGNOSIS — H401131 Primary open-angle glaucoma, bilateral, mild stage: Secondary | ICD-10-CM | POA: Diagnosis not present

## 2018-12-19 DIAGNOSIS — H2513 Age-related nuclear cataract, bilateral: Secondary | ICD-10-CM | POA: Diagnosis not present

## 2018-12-19 DIAGNOSIS — H18413 Arcus senilis, bilateral: Secondary | ICD-10-CM | POA: Diagnosis not present

## 2018-12-19 DIAGNOSIS — H43813 Vitreous degeneration, bilateral: Secondary | ICD-10-CM | POA: Diagnosis not present

## 2019-01-12 DIAGNOSIS — F329 Major depressive disorder, single episode, unspecified: Secondary | ICD-10-CM | POA: Diagnosis not present

## 2019-01-12 DIAGNOSIS — E7849 Other hyperlipidemia: Secondary | ICD-10-CM | POA: Diagnosis not present

## 2019-01-12 DIAGNOSIS — M810 Age-related osteoporosis without current pathological fracture: Secondary | ICD-10-CM | POA: Diagnosis not present

## 2019-03-05 DIAGNOSIS — J309 Allergic rhinitis, unspecified: Secondary | ICD-10-CM | POA: Diagnosis not present

## 2019-03-05 DIAGNOSIS — Z6826 Body mass index (BMI) 26.0-26.9, adult: Secondary | ICD-10-CM | POA: Diagnosis not present

## 2019-03-05 DIAGNOSIS — E663 Overweight: Secondary | ICD-10-CM | POA: Diagnosis not present

## 2019-03-05 DIAGNOSIS — R7309 Other abnormal glucose: Secondary | ICD-10-CM | POA: Diagnosis not present

## 2019-03-05 DIAGNOSIS — F419 Anxiety disorder, unspecified: Secondary | ICD-10-CM | POA: Diagnosis not present

## 2019-03-05 DIAGNOSIS — M810 Age-related osteoporosis without current pathological fracture: Secondary | ICD-10-CM | POA: Diagnosis not present

## 2019-03-05 DIAGNOSIS — I10 Essential (primary) hypertension: Secondary | ICD-10-CM | POA: Diagnosis not present

## 2019-03-05 DIAGNOSIS — Z1389 Encounter for screening for other disorder: Secondary | ICD-10-CM | POA: Diagnosis not present

## 2019-03-05 DIAGNOSIS — K219 Gastro-esophageal reflux disease without esophagitis: Secondary | ICD-10-CM | POA: Diagnosis not present

## 2019-03-05 DIAGNOSIS — H409 Unspecified glaucoma: Secondary | ICD-10-CM | POA: Diagnosis not present

## 2019-03-05 DIAGNOSIS — Z0001 Encounter for general adult medical examination with abnormal findings: Secondary | ICD-10-CM | POA: Diagnosis not present

## 2019-03-05 DIAGNOSIS — E7489 Other specified disorders of carbohydrate metabolism: Secondary | ICD-10-CM | POA: Diagnosis not present

## 2019-03-05 DIAGNOSIS — G894 Chronic pain syndrome: Secondary | ICD-10-CM | POA: Diagnosis not present

## 2019-03-05 DIAGNOSIS — E7849 Other hyperlipidemia: Secondary | ICD-10-CM | POA: Diagnosis not present

## 2019-03-15 DIAGNOSIS — M810 Age-related osteoporosis without current pathological fracture: Secondary | ICD-10-CM | POA: Diagnosis not present

## 2019-03-15 DIAGNOSIS — F329 Major depressive disorder, single episode, unspecified: Secondary | ICD-10-CM | POA: Diagnosis not present

## 2019-03-15 DIAGNOSIS — E063 Autoimmune thyroiditis: Secondary | ICD-10-CM | POA: Diagnosis not present

## 2019-03-15 DIAGNOSIS — E7849 Other hyperlipidemia: Secondary | ICD-10-CM | POA: Diagnosis not present

## 2019-03-30 DIAGNOSIS — D2271 Melanocytic nevi of right lower limb, including hip: Secondary | ICD-10-CM | POA: Diagnosis not present

## 2019-03-30 DIAGNOSIS — D485 Neoplasm of uncertain behavior of skin: Secondary | ICD-10-CM | POA: Diagnosis not present

## 2019-03-30 DIAGNOSIS — D1801 Hemangioma of skin and subcutaneous tissue: Secondary | ICD-10-CM | POA: Diagnosis not present

## 2019-03-30 DIAGNOSIS — L570 Actinic keratosis: Secondary | ICD-10-CM | POA: Diagnosis not present

## 2019-03-30 DIAGNOSIS — D2272 Melanocytic nevi of left lower limb, including hip: Secondary | ICD-10-CM | POA: Diagnosis not present

## 2019-03-30 DIAGNOSIS — Z85828 Personal history of other malignant neoplasm of skin: Secondary | ICD-10-CM | POA: Diagnosis not present

## 2019-03-30 DIAGNOSIS — L821 Other seborrheic keratosis: Secondary | ICD-10-CM | POA: Diagnosis not present

## 2019-04-16 ENCOUNTER — Ambulatory Visit: Payer: Medicare HMO | Attending: Internal Medicine

## 2019-04-16 DIAGNOSIS — Z23 Encounter for immunization: Secondary | ICD-10-CM

## 2019-04-16 NOTE — Progress Notes (Signed)
   Covid-19 Vaccination Clinic  Name:  Lori Meadows    MRN: [id number] DOB: August 29, 1950  04/16/2019  [name] was observed post Covid-19 immunization for 15 minutes without incident. She was provided with Vaccine Information Sheet and instruction to access the V-Safe system.   [name] was instructed to call 911 with any severe reactions post vaccine: Marland Kitchen Difficulty breathing  . Swelling of face and throat  . A fast heartbeat  . A bad rash all over body  . Dizziness and weakness   Immunizations Administered    Name Date Dose VIS Date Route   Moderna COVID-19 Vaccine 04/16/2019 12:11 PM 0.5 mL 01/13/2019 Intramuscular   Manufacturer: Moderna   Lot: [id number]   [id number]

## 2019-05-13 DIAGNOSIS — E7849 Other hyperlipidemia: Secondary | ICD-10-CM | POA: Diagnosis not present

## 2019-05-13 DIAGNOSIS — F329 Major depressive disorder, single episode, unspecified: Secondary | ICD-10-CM | POA: Diagnosis not present

## 2019-05-13 DIAGNOSIS — M810 Age-related osteoporosis without current pathological fracture: Secondary | ICD-10-CM | POA: Diagnosis not present

## 2019-05-13 DIAGNOSIS — E039 Hypothyroidism, unspecified: Secondary | ICD-10-CM | POA: Diagnosis not present

## 2019-05-19 ENCOUNTER — Ambulatory Visit: Payer: Medicare HMO

## 2019-06-12 DIAGNOSIS — F329 Major depressive disorder, single episode, unspecified: Secondary | ICD-10-CM | POA: Diagnosis not present

## 2019-06-12 DIAGNOSIS — E7849 Other hyperlipidemia: Secondary | ICD-10-CM | POA: Diagnosis not present

## 2019-06-12 DIAGNOSIS — M810 Age-related osteoporosis without current pathological fracture: Secondary | ICD-10-CM | POA: Diagnosis not present

## 2019-06-12 DIAGNOSIS — E063 Autoimmune thyroiditis: Secondary | ICD-10-CM | POA: Diagnosis not present

## 2019-07-08 DIAGNOSIS — E063 Autoimmune thyroiditis: Secondary | ICD-10-CM | POA: Diagnosis not present

## 2019-07-08 DIAGNOSIS — Z23 Encounter for immunization: Secondary | ICD-10-CM | POA: Diagnosis not present

## 2019-07-08 DIAGNOSIS — M810 Age-related osteoporosis without current pathological fracture: Secondary | ICD-10-CM | POA: Diagnosis not present

## 2019-07-08 DIAGNOSIS — F419 Anxiety disorder, unspecified: Secondary | ICD-10-CM | POA: Diagnosis not present

## 2019-07-08 DIAGNOSIS — Z6825 Body mass index (BMI) 25.0-25.9, adult: Secondary | ICD-10-CM | POA: Diagnosis not present

## 2019-07-08 DIAGNOSIS — N189 Chronic kidney disease, unspecified: Secondary | ICD-10-CM | POA: Diagnosis not present

## 2019-07-08 DIAGNOSIS — I10 Essential (primary) hypertension: Secondary | ICD-10-CM | POA: Diagnosis not present

## 2019-07-08 DIAGNOSIS — K219 Gastro-esophageal reflux disease without esophagitis: Secondary | ICD-10-CM | POA: Diagnosis not present

## 2019-09-11 DIAGNOSIS — F329 Major depressive disorder, single episode, unspecified: Secondary | ICD-10-CM | POA: Diagnosis not present

## 2019-09-11 DIAGNOSIS — M810 Age-related osteoporosis without current pathological fracture: Secondary | ICD-10-CM | POA: Diagnosis not present

## 2019-09-11 DIAGNOSIS — E063 Autoimmune thyroiditis: Secondary | ICD-10-CM | POA: Diagnosis not present

## 2019-09-11 DIAGNOSIS — E7849 Other hyperlipidemia: Secondary | ICD-10-CM | POA: Diagnosis not present

## 2019-10-05 DIAGNOSIS — K635 Polyp of colon: Secondary | ICD-10-CM | POA: Diagnosis not present

## 2019-10-05 DIAGNOSIS — Z1211 Encounter for screening for malignant neoplasm of colon: Secondary | ICD-10-CM | POA: Diagnosis not present

## 2019-10-07 DIAGNOSIS — K635 Polyp of colon: Secondary | ICD-10-CM | POA: Diagnosis not present

## 2019-10-27 DIAGNOSIS — F419 Anxiety disorder, unspecified: Secondary | ICD-10-CM | POA: Diagnosis not present

## 2019-10-27 DIAGNOSIS — E063 Autoimmune thyroiditis: Secondary | ICD-10-CM | POA: Diagnosis not present

## 2019-10-27 DIAGNOSIS — Z6824 Body mass index (BMI) 24.0-24.9, adult: Secondary | ICD-10-CM | POA: Diagnosis not present

## 2019-10-27 DIAGNOSIS — K219 Gastro-esophageal reflux disease without esophagitis: Secondary | ICD-10-CM | POA: Diagnosis not present

## 2019-11-12 DIAGNOSIS — E039 Hypothyroidism, unspecified: Secondary | ICD-10-CM | POA: Diagnosis not present

## 2019-11-12 DIAGNOSIS — F329 Major depressive disorder, single episode, unspecified: Secondary | ICD-10-CM | POA: Diagnosis not present

## 2019-11-12 DIAGNOSIS — M810 Age-related osteoporosis without current pathological fracture: Secondary | ICD-10-CM | POA: Diagnosis not present

## 2019-11-12 DIAGNOSIS — E7849 Other hyperlipidemia: Secondary | ICD-10-CM | POA: Diagnosis not present

## 2020-01-01 DIAGNOSIS — H2513 Age-related nuclear cataract, bilateral: Secondary | ICD-10-CM | POA: Diagnosis not present

## 2020-01-01 DIAGNOSIS — H401131 Primary open-angle glaucoma, bilateral, mild stage: Secondary | ICD-10-CM | POA: Diagnosis not present

## 2020-01-01 DIAGNOSIS — H43813 Vitreous degeneration, bilateral: Secondary | ICD-10-CM | POA: Diagnosis not present

## 2020-01-01 DIAGNOSIS — H04123 Dry eye syndrome of bilateral lacrimal glands: Secondary | ICD-10-CM | POA: Diagnosis not present

## 2020-01-12 DIAGNOSIS — E7849 Other hyperlipidemia: Secondary | ICD-10-CM | POA: Diagnosis not present

## 2020-01-12 DIAGNOSIS — E039 Hypothyroidism, unspecified: Secondary | ICD-10-CM | POA: Diagnosis not present

## 2020-01-12 DIAGNOSIS — F329 Major depressive disorder, single episode, unspecified: Secondary | ICD-10-CM | POA: Diagnosis not present

## 2020-01-12 DIAGNOSIS — M810 Age-related osteoporosis without current pathological fracture: Secondary | ICD-10-CM | POA: Diagnosis not present

## 2020-02-04 DIAGNOSIS — Z6824 Body mass index (BMI) 24.0-24.9, adult: Secondary | ICD-10-CM | POA: Diagnosis not present

## 2020-02-04 DIAGNOSIS — K219 Gastro-esophageal reflux disease without esophagitis: Secondary | ICD-10-CM | POA: Diagnosis not present

## 2020-02-04 DIAGNOSIS — E063 Autoimmune thyroiditis: Secondary | ICD-10-CM | POA: Diagnosis not present

## 2020-02-04 DIAGNOSIS — F419 Anxiety disorder, unspecified: Secondary | ICD-10-CM | POA: Diagnosis not present

## 2020-03-29 DIAGNOSIS — D1801 Hemangioma of skin and subcutaneous tissue: Secondary | ICD-10-CM | POA: Diagnosis not present

## 2020-03-29 DIAGNOSIS — Z85828 Personal history of other malignant neoplasm of skin: Secondary | ICD-10-CM | POA: Diagnosis not present

## 2020-03-29 DIAGNOSIS — D2271 Melanocytic nevi of right lower limb, including hip: Secondary | ICD-10-CM | POA: Diagnosis not present

## 2020-03-29 DIAGNOSIS — L821 Other seborrheic keratosis: Secondary | ICD-10-CM | POA: Diagnosis not present

## 2020-05-04 DIAGNOSIS — E063 Autoimmune thyroiditis: Secondary | ICD-10-CM | POA: Diagnosis not present

## 2020-05-04 DIAGNOSIS — Z6824 Body mass index (BMI) 24.0-24.9, adult: Secondary | ICD-10-CM | POA: Diagnosis not present

## 2020-05-04 DIAGNOSIS — Z1331 Encounter for screening for depression: Secondary | ICD-10-CM | POA: Diagnosis not present

## 2020-05-04 DIAGNOSIS — E7849 Other hyperlipidemia: Secondary | ICD-10-CM | POA: Diagnosis not present

## 2020-05-04 DIAGNOSIS — Z1389 Encounter for screening for other disorder: Secondary | ICD-10-CM | POA: Diagnosis not present

## 2020-05-04 DIAGNOSIS — E782 Mixed hyperlipidemia: Secondary | ICD-10-CM | POA: Diagnosis not present

## 2020-05-04 DIAGNOSIS — I10 Essential (primary) hypertension: Secondary | ICD-10-CM | POA: Diagnosis not present

## 2020-05-04 DIAGNOSIS — Z0001 Encounter for general adult medical examination with abnormal findings: Secondary | ICD-10-CM | POA: Diagnosis not present

## 2020-05-11 DIAGNOSIS — E039 Hypothyroidism, unspecified: Secondary | ICD-10-CM | POA: Diagnosis not present

## 2020-05-11 DIAGNOSIS — E7849 Other hyperlipidemia: Secondary | ICD-10-CM | POA: Diagnosis not present

## 2020-06-11 DIAGNOSIS — E7849 Other hyperlipidemia: Secondary | ICD-10-CM | POA: Diagnosis not present

## 2020-06-11 DIAGNOSIS — E039 Hypothyroidism, unspecified: Secondary | ICD-10-CM | POA: Diagnosis not present

## 2020-07-13 DIAGNOSIS — Z1389 Encounter for screening for other disorder: Secondary | ICD-10-CM | POA: Diagnosis not present

## 2020-08-11 DIAGNOSIS — E039 Hypothyroidism, unspecified: Secondary | ICD-10-CM | POA: Diagnosis not present

## 2020-08-11 DIAGNOSIS — E782 Mixed hyperlipidemia: Secondary | ICD-10-CM | POA: Diagnosis not present

## 2020-08-12 DIAGNOSIS — U099 Post covid-19 condition, unspecified: Secondary | ICD-10-CM | POA: Diagnosis not present

## 2020-08-12 DIAGNOSIS — F419 Anxiety disorder, unspecified: Secondary | ICD-10-CM | POA: Diagnosis not present

## 2020-08-12 DIAGNOSIS — R42 Dizziness and giddiness: Secondary | ICD-10-CM | POA: Diagnosis not present

## 2020-08-12 DIAGNOSIS — R5383 Other fatigue: Secondary | ICD-10-CM | POA: Diagnosis not present

## 2020-08-12 DIAGNOSIS — I10 Essential (primary) hypertension: Secondary | ICD-10-CM | POA: Diagnosis not present

## 2020-08-12 DIAGNOSIS — E559 Vitamin D deficiency, unspecified: Secondary | ICD-10-CM | POA: Diagnosis not present

## 2020-08-12 DIAGNOSIS — Z6822 Body mass index (BMI) 22.0-22.9, adult: Secondary | ICD-10-CM | POA: Diagnosis not present

## 2020-08-12 DIAGNOSIS — E538 Deficiency of other specified B group vitamins: Secondary | ICD-10-CM | POA: Diagnosis not present

## 2020-09-19 DIAGNOSIS — Z1231 Encounter for screening mammogram for malignant neoplasm of breast: Secondary | ICD-10-CM | POA: Diagnosis not present

## 2020-09-19 DIAGNOSIS — Z6823 Body mass index (BMI) 23.0-23.9, adult: Secondary | ICD-10-CM | POA: Diagnosis not present

## 2020-09-19 DIAGNOSIS — Z01419 Encounter for gynecological examination (general) (routine) without abnormal findings: Secondary | ICD-10-CM | POA: Diagnosis not present

## 2020-09-19 DIAGNOSIS — N958 Other specified menopausal and perimenopausal disorders: Secondary | ICD-10-CM | POA: Diagnosis not present

## 2020-09-19 DIAGNOSIS — M8588 Other specified disorders of bone density and structure, other site: Secondary | ICD-10-CM | POA: Diagnosis not present

## 2020-09-19 DIAGNOSIS — R8290 Unspecified abnormal findings in urine: Secondary | ICD-10-CM | POA: Diagnosis not present

## 2020-11-04 DIAGNOSIS — Z23 Encounter for immunization: Secondary | ICD-10-CM | POA: Diagnosis not present

## 2020-11-21 DIAGNOSIS — R3129 Other microscopic hematuria: Secondary | ICD-10-CM | POA: Diagnosis not present

## 2020-11-21 DIAGNOSIS — E663 Overweight: Secondary | ICD-10-CM | POA: Diagnosis not present

## 2020-11-21 DIAGNOSIS — I10 Essential (primary) hypertension: Secondary | ICD-10-CM | POA: Diagnosis not present

## 2020-11-21 DIAGNOSIS — E063 Autoimmune thyroiditis: Secondary | ICD-10-CM | POA: Diagnosis not present

## 2020-11-21 DIAGNOSIS — F419 Anxiety disorder, unspecified: Secondary | ICD-10-CM | POA: Diagnosis not present

## 2020-11-21 DIAGNOSIS — Z23 Encounter for immunization: Secondary | ICD-10-CM | POA: Diagnosis not present

## 2020-11-21 DIAGNOSIS — Z6823 Body mass index (BMI) 23.0-23.9, adult: Secondary | ICD-10-CM | POA: Diagnosis not present

## 2020-11-30 DIAGNOSIS — R319 Hematuria, unspecified: Secondary | ICD-10-CM | POA: Diagnosis not present

## 2021-01-13 DIAGNOSIS — H2513 Age-related nuclear cataract, bilateral: Secondary | ICD-10-CM | POA: Diagnosis not present

## 2021-01-13 DIAGNOSIS — H04123 Dry eye syndrome of bilateral lacrimal glands: Secondary | ICD-10-CM | POA: Diagnosis not present

## 2021-01-13 DIAGNOSIS — H401131 Primary open-angle glaucoma, bilateral, mild stage: Secondary | ICD-10-CM | POA: Diagnosis not present

## 2021-01-13 DIAGNOSIS — H43813 Vitreous degeneration, bilateral: Secondary | ICD-10-CM | POA: Diagnosis not present

## 2021-02-21 DIAGNOSIS — Z0001 Encounter for general adult medical examination with abnormal findings: Secondary | ICD-10-CM | POA: Diagnosis not present

## 2021-02-21 DIAGNOSIS — Z6824 Body mass index (BMI) 24.0-24.9, adult: Secondary | ICD-10-CM | POA: Diagnosis not present

## 2021-02-21 DIAGNOSIS — E782 Mixed hyperlipidemia: Secondary | ICD-10-CM | POA: Diagnosis not present

## 2021-02-21 DIAGNOSIS — M810 Age-related osteoporosis without current pathological fracture: Secondary | ICD-10-CM | POA: Diagnosis not present

## 2021-02-21 DIAGNOSIS — K219 Gastro-esophageal reflux disease without esophagitis: Secondary | ICD-10-CM | POA: Diagnosis not present

## 2021-02-21 DIAGNOSIS — Z1331 Encounter for screening for depression: Secondary | ICD-10-CM | POA: Diagnosis not present

## 2021-02-21 DIAGNOSIS — I10 Essential (primary) hypertension: Secondary | ICD-10-CM | POA: Diagnosis not present

## 2021-02-21 DIAGNOSIS — E663 Overweight: Secondary | ICD-10-CM | POA: Diagnosis not present

## 2021-02-21 DIAGNOSIS — E063 Autoimmune thyroiditis: Secondary | ICD-10-CM | POA: Diagnosis not present

## 2021-03-29 DIAGNOSIS — Z85828 Personal history of other malignant neoplasm of skin: Secondary | ICD-10-CM | POA: Diagnosis not present

## 2021-03-29 DIAGNOSIS — L820 Inflamed seborrheic keratosis: Secondary | ICD-10-CM | POA: Diagnosis not present

## 2021-03-29 DIAGNOSIS — D0471 Carcinoma in situ of skin of right lower limb, including hip: Secondary | ICD-10-CM | POA: Diagnosis not present

## 2021-03-29 DIAGNOSIS — L821 Other seborrheic keratosis: Secondary | ICD-10-CM | POA: Diagnosis not present

## 2021-03-29 DIAGNOSIS — D225 Melanocytic nevi of trunk: Secondary | ICD-10-CM | POA: Diagnosis not present

## 2021-04-11 DIAGNOSIS — R944 Abnormal results of kidney function studies: Secondary | ICD-10-CM | POA: Diagnosis not present

## 2021-04-11 DIAGNOSIS — E039 Hypothyroidism, unspecified: Secondary | ICD-10-CM | POA: Diagnosis not present

## 2021-05-02 DIAGNOSIS — L601 Onycholysis: Secondary | ICD-10-CM | POA: Diagnosis not present

## 2021-05-02 DIAGNOSIS — Z85828 Personal history of other malignant neoplasm of skin: Secondary | ICD-10-CM | POA: Diagnosis not present

## 2021-05-02 DIAGNOSIS — D0471 Carcinoma in situ of skin of right lower limb, including hip: Secondary | ICD-10-CM | POA: Diagnosis not present

## 2021-05-29 DIAGNOSIS — R3129 Other microscopic hematuria: Secondary | ICD-10-CM | POA: Diagnosis not present

## 2021-05-29 DIAGNOSIS — E063 Autoimmune thyroiditis: Secondary | ICD-10-CM | POA: Diagnosis not present

## 2021-05-29 DIAGNOSIS — I10 Essential (primary) hypertension: Secondary | ICD-10-CM | POA: Diagnosis not present

## 2021-05-29 DIAGNOSIS — F419 Anxiety disorder, unspecified: Secondary | ICD-10-CM | POA: Diagnosis not present

## 2021-05-29 DIAGNOSIS — Z6823 Body mass index (BMI) 23.0-23.9, adult: Secondary | ICD-10-CM | POA: Diagnosis not present

## 2021-06-06 DIAGNOSIS — R319 Hematuria, unspecified: Secondary | ICD-10-CM | POA: Diagnosis not present

## 2021-06-14 DIAGNOSIS — Z85828 Personal history of other malignant neoplasm of skin: Secondary | ICD-10-CM | POA: Diagnosis not present

## 2021-06-14 DIAGNOSIS — D2272 Melanocytic nevi of left lower limb, including hip: Secondary | ICD-10-CM | POA: Diagnosis not present

## 2021-06-14 DIAGNOSIS — D485 Neoplasm of uncertain behavior of skin: Secondary | ICD-10-CM | POA: Diagnosis not present

## 2021-07-18 DIAGNOSIS — D485 Neoplasm of uncertain behavior of skin: Secondary | ICD-10-CM | POA: Diagnosis not present

## 2021-07-18 DIAGNOSIS — L988 Other specified disorders of the skin and subcutaneous tissue: Secondary | ICD-10-CM | POA: Diagnosis not present

## 2021-07-18 DIAGNOSIS — Z85828 Personal history of other malignant neoplasm of skin: Secondary | ICD-10-CM | POA: Diagnosis not present

## 2021-08-01 DIAGNOSIS — Z4802 Encounter for removal of sutures: Secondary | ICD-10-CM | POA: Diagnosis not present

## 2021-08-25 DIAGNOSIS — Z6823 Body mass index (BMI) 23.0-23.9, adult: Secondary | ICD-10-CM | POA: Diagnosis not present

## 2021-08-25 DIAGNOSIS — I10 Essential (primary) hypertension: Secondary | ICD-10-CM | POA: Diagnosis not present

## 2021-08-25 DIAGNOSIS — E039 Hypothyroidism, unspecified: Secondary | ICD-10-CM | POA: Diagnosis not present

## 2021-08-25 DIAGNOSIS — F419 Anxiety disorder, unspecified: Secondary | ICD-10-CM | POA: Diagnosis not present

## 2021-08-25 DIAGNOSIS — R319 Hematuria, unspecified: Secondary | ICD-10-CM | POA: Diagnosis not present

## 2021-08-25 DIAGNOSIS — R944 Abnormal results of kidney function studies: Secondary | ICD-10-CM | POA: Diagnosis not present

## 2022-01-09 DIAGNOSIS — Z113 Encounter for screening for infections with a predominantly sexual mode of transmission: Secondary | ICD-10-CM | POA: Diagnosis not present

## 2022-01-09 DIAGNOSIS — Z01419 Encounter for gynecological examination (general) (routine) without abnormal findings: Secondary | ICD-10-CM | POA: Diagnosis not present

## 2022-01-09 DIAGNOSIS — Z6825 Body mass index (BMI) 25.0-25.9, adult: Secondary | ICD-10-CM | POA: Diagnosis not present

## 2022-01-09 DIAGNOSIS — Z1231 Encounter for screening mammogram for malignant neoplasm of breast: Secondary | ICD-10-CM | POA: Diagnosis not present

## 2022-01-13 DIAGNOSIS — R6884 Jaw pain: Secondary | ICD-10-CM | POA: Diagnosis not present

## 2022-01-13 DIAGNOSIS — I10 Essential (primary) hypertension: Secondary | ICD-10-CM | POA: Diagnosis not present

## 2022-01-13 DIAGNOSIS — Z6825 Body mass index (BMI) 25.0-25.9, adult: Secondary | ICD-10-CM | POA: Diagnosis not present

## 2022-01-13 DIAGNOSIS — Z0131 Encounter for examination of blood pressure with abnormal findings: Secondary | ICD-10-CM | POA: Diagnosis not present

## 2022-01-13 DIAGNOSIS — M25511 Pain in right shoulder: Secondary | ICD-10-CM | POA: Diagnosis not present

## 2022-01-13 DIAGNOSIS — E663 Overweight: Secondary | ICD-10-CM | POA: Diagnosis not present

## 2022-01-13 DIAGNOSIS — R9431 Abnormal electrocardiogram [ECG] [EKG]: Secondary | ICD-10-CM | POA: Diagnosis not present

## 2022-01-26 DIAGNOSIS — H401131 Primary open-angle glaucoma, bilateral, mild stage: Secondary | ICD-10-CM | POA: Diagnosis not present

## 2022-01-26 DIAGNOSIS — H2513 Age-related nuclear cataract, bilateral: Secondary | ICD-10-CM | POA: Diagnosis not present

## 2022-01-26 DIAGNOSIS — H43813 Vitreous degeneration, bilateral: Secondary | ICD-10-CM | POA: Diagnosis not present

## 2022-01-26 DIAGNOSIS — H04123 Dry eye syndrome of bilateral lacrimal glands: Secondary | ICD-10-CM | POA: Diagnosis not present

## 2022-02-02 DIAGNOSIS — R311 Benign essential microscopic hematuria: Secondary | ICD-10-CM | POA: Diagnosis not present

## 2022-02-02 DIAGNOSIS — I10 Essential (primary) hypertension: Secondary | ICD-10-CM | POA: Diagnosis not present

## 2022-02-02 DIAGNOSIS — K219 Gastro-esophageal reflux disease without esophagitis: Secondary | ICD-10-CM | POA: Diagnosis not present

## 2022-02-02 DIAGNOSIS — Z6826 Body mass index (BMI) 26.0-26.9, adult: Secondary | ICD-10-CM | POA: Diagnosis not present

## 2022-02-02 DIAGNOSIS — M4306 Spondylolysis, lumbar region: Secondary | ICD-10-CM | POA: Diagnosis not present

## 2022-02-02 DIAGNOSIS — G894 Chronic pain syndrome: Secondary | ICD-10-CM | POA: Diagnosis not present

## 2022-02-02 DIAGNOSIS — K648 Other hemorrhoids: Secondary | ICD-10-CM | POA: Diagnosis not present

## 2022-02-02 DIAGNOSIS — R319 Hematuria, unspecified: Secondary | ICD-10-CM | POA: Diagnosis not present

## 2022-02-02 DIAGNOSIS — F419 Anxiety disorder, unspecified: Secondary | ICD-10-CM | POA: Diagnosis not present

## 2022-02-02 DIAGNOSIS — E663 Overweight: Secondary | ICD-10-CM | POA: Diagnosis not present

## 2022-05-14 DIAGNOSIS — I10 Essential (primary) hypertension: Secondary | ICD-10-CM | POA: Diagnosis not present

## 2022-05-14 DIAGNOSIS — G9332 Myalgic encephalomyelitis/chronic fatigue syndrome: Secondary | ICD-10-CM | POA: Diagnosis not present

## 2022-05-14 DIAGNOSIS — E039 Hypothyroidism, unspecified: Secondary | ICD-10-CM | POA: Diagnosis not present

## 2022-05-14 DIAGNOSIS — E559 Vitamin D deficiency, unspecified: Secondary | ICD-10-CM | POA: Diagnosis not present

## 2022-05-14 DIAGNOSIS — Z6826 Body mass index (BMI) 26.0-26.9, adult: Secondary | ICD-10-CM | POA: Diagnosis not present

## 2022-05-14 DIAGNOSIS — M4306 Spondylolysis, lumbar region: Secondary | ICD-10-CM | POA: Diagnosis not present

## 2022-05-14 DIAGNOSIS — R311 Benign essential microscopic hematuria: Secondary | ICD-10-CM | POA: Diagnosis not present

## 2022-05-14 DIAGNOSIS — E663 Overweight: Secondary | ICD-10-CM | POA: Diagnosis not present

## 2022-05-14 DIAGNOSIS — D518 Other vitamin B12 deficiency anemias: Secondary | ICD-10-CM | POA: Diagnosis not present

## 2022-05-14 DIAGNOSIS — Z1331 Encounter for screening for depression: Secondary | ICD-10-CM | POA: Diagnosis not present

## 2022-05-14 DIAGNOSIS — Z0001 Encounter for general adult medical examination with abnormal findings: Secondary | ICD-10-CM | POA: Diagnosis not present

## 2022-05-14 DIAGNOSIS — K219 Gastro-esophageal reflux disease without esophagitis: Secondary | ICD-10-CM | POA: Diagnosis not present

## 2022-05-14 DIAGNOSIS — F419 Anxiety disorder, unspecified: Secondary | ICD-10-CM | POA: Diagnosis not present

## 2022-05-15 DIAGNOSIS — L905 Scar conditions and fibrosis of skin: Secondary | ICD-10-CM | POA: Diagnosis not present

## 2022-05-15 DIAGNOSIS — Z85828 Personal history of other malignant neoplasm of skin: Secondary | ICD-10-CM | POA: Diagnosis not present

## 2022-05-15 DIAGNOSIS — L821 Other seborrheic keratosis: Secondary | ICD-10-CM | POA: Diagnosis not present

## 2022-05-15 DIAGNOSIS — L603 Nail dystrophy: Secondary | ICD-10-CM | POA: Diagnosis not present

## 2022-05-15 DIAGNOSIS — L570 Actinic keratosis: Secondary | ICD-10-CM | POA: Diagnosis not present

## 2022-06-29 DIAGNOSIS — E039 Hypothyroidism, unspecified: Secondary | ICD-10-CM | POA: Diagnosis not present

## 2022-09-18 ENCOUNTER — Other Ambulatory Visit (HOSPITAL_COMMUNITY): Payer: Self-pay | Admitting: Internal Medicine

## 2022-09-18 DIAGNOSIS — E2839 Other primary ovarian failure: Secondary | ICD-10-CM

## 2023-01-22 DIAGNOSIS — I10 Essential (primary) hypertension: Secondary | ICD-10-CM | POA: Diagnosis not present

## 2023-01-22 DIAGNOSIS — F419 Anxiety disorder, unspecified: Secondary | ICD-10-CM | POA: Diagnosis not present

## 2023-01-22 DIAGNOSIS — Z6825 Body mass index (BMI) 25.0-25.9, adult: Secondary | ICD-10-CM | POA: Diagnosis not present

## 2023-01-22 DIAGNOSIS — M4306 Spondylolysis, lumbar region: Secondary | ICD-10-CM | POA: Diagnosis not present

## 2023-01-22 DIAGNOSIS — E663 Overweight: Secondary | ICD-10-CM | POA: Diagnosis not present

## 2023-01-22 DIAGNOSIS — E039 Hypothyroidism, unspecified: Secondary | ICD-10-CM | POA: Diagnosis not present

## 2023-02-11 DIAGNOSIS — H04123 Dry eye syndrome of bilateral lacrimal glands: Secondary | ICD-10-CM | POA: Diagnosis not present

## 2023-02-11 DIAGNOSIS — H43813 Vitreous degeneration, bilateral: Secondary | ICD-10-CM | POA: Diagnosis not present

## 2023-02-11 DIAGNOSIS — H2513 Age-related nuclear cataract, bilateral: Secondary | ICD-10-CM | POA: Diagnosis not present

## 2023-02-11 DIAGNOSIS — H401131 Primary open-angle glaucoma, bilateral, mild stage: Secondary | ICD-10-CM | POA: Diagnosis not present

## 2023-05-23 ENCOUNTER — Ambulatory Visit: Payer: Medicare HMO | Admitting: Obstetrics & Gynecology

## 2023-05-23 ENCOUNTER — Encounter: Payer: Self-pay | Admitting: Obstetrics & Gynecology

## 2023-05-23 VITALS — BP 169/95 | Ht 65.0 in | Wt 144.6 lb

## 2023-05-23 DIAGNOSIS — Z1231 Encounter for screening mammogram for malignant neoplasm of breast: Secondary | ICD-10-CM | POA: Diagnosis not present

## 2023-05-23 DIAGNOSIS — Z01419 Encounter for gynecological examination (general) (routine) without abnormal findings: Secondary | ICD-10-CM

## 2023-05-23 NOTE — Progress Notes (Signed)
   WELL-WOMAN EXAMINATION Patient name: Lori Meadows MRN [id number]  Date of birth: 04-15-1950 Chief Complaint:   Gynecologic Exam  History of Present Illness:   Lori Meadows is a 73 y.o. G82P3003 PM female being seen today for a routine well-woman exam.  Today she notes no acute gyn concerns.  Denies vaginal discharge, itching or irritation.  Denies vaginal bleeding.   Notes elevated BP due to issues at home with her son.   No LMP recorded. Patient is postmenopausal.  Last pap 2020 [facility] negative.  Last mammogram: ordered. Last colonoscopy: not sure maybe 2019/2020      No data to display            Review of Systems:   Pertinent items are noted in HPI Denies any headaches, blurred vision, fatigue, shortness of breath, chest pain, abdominal pain, bowel movements, urination, or intercourse unless otherwise stated above.  Pertinent History Reviewed:  Reviewed past medical,surgical, social and family history.  Reviewed problem list, medications and allergies. Physical Assessment:   Vitals:   05/23/23 1549 05/23/23 1623  BP: (!) 164/90 (!) 169/95  Weight: 144 lb 9.6 oz (65.6 kg)   Height: 5\' 5"  (1.651 m)   Body mass index is 24.06 kg/m.        Physical Examination:   General appearance - well appearing, and in no distress  Mental status - alert, oriented to person, place, and time  Psych:  She has a normal mood and affect  Skin - warm and dry, normal color, no suspicious lesions noted  Chest - effort normal, all lung fields clear to auscultation bilaterally  Heart - normal rate and regular rhythm  Neck:  midline trachea, no thyromegaly or nodules  Breasts - breasts appear normal, no suspicious masses, no skin or nipple changes or  axillary nodes  Abdomen - soft, nontender, nondistended, no masses or organomegaly  Pelvic - VULVA: normal appearing vulva with no masses, tenderness or lesions  VAGINA: normal appearing vagina with normal color and  discharge, no lesions- atrophic appearance noted  CERVIX: normal appearing cervix without discharge or lesions, no CMT  UTERUS: uterus is felt to be normal size, shape, consistency and nontender   ADNEXA: No adnexal masses or tenderness noted. Extremities:  No swelling or varicosities noted  Chaperone: Faith Rogue     Assessment & Plan:  1) Well-Woman Exam - Reviewed ASCCP guidelines, patient no longer needs Pap smear - Mammogram ordered   Orders Placed This Encounter  Procedures   MM 3D SCREENING MAMMOGRAM BILATERAL BREAST    Meds: No orders of the defined types were placed in this encounter.   Follow-up: Return in about 2 years (around 05/22/2025) for Annual.   Lori Hidalgo, DO Attending Obstetrician & Gynecologist, [facility], [facility]

## 2023-06-10 ENCOUNTER — Ambulatory Visit (HOSPITAL_COMMUNITY)
Admission: RE | Admit: 2023-06-10 | Discharge: 2023-06-10 | Disposition: A | Discharge status: Home / Self Care | Payer: Medicare HMO | Source: Ambulatory Visit | Attending: Internal Medicine | Admitting: Internal Medicine

## 2023-06-10 DIAGNOSIS — E2839 Other primary ovarian failure: Secondary | ICD-10-CM | POA: Insufficient documentation

## 2023-06-27 DIAGNOSIS — F419 Anxiety disorder, unspecified: Secondary | ICD-10-CM | POA: Diagnosis not present

## 2023-06-27 DIAGNOSIS — I10 Essential (primary) hypertension: Secondary | ICD-10-CM | POA: Diagnosis not present

## 2023-06-27 DIAGNOSIS — E039 Hypothyroidism, unspecified: Secondary | ICD-10-CM | POA: Diagnosis not present

## 2023-06-27 DIAGNOSIS — R311 Benign essential microscopic hematuria: Secondary | ICD-10-CM | POA: Diagnosis not present

## 2023-06-27 DIAGNOSIS — Z6824 Body mass index (BMI) 24.0-24.9, adult: Secondary | ICD-10-CM | POA: Diagnosis not present

## 2023-06-27 DIAGNOSIS — Z23 Encounter for immunization: Secondary | ICD-10-CM | POA: Diagnosis not present

## 2023-06-27 DIAGNOSIS — M4306 Spondylolysis, lumbar region: Secondary | ICD-10-CM | POA: Diagnosis not present

## 2023-09-12 DIAGNOSIS — F419 Anxiety disorder, unspecified: Secondary | ICD-10-CM | POA: Diagnosis not present

## 2023-09-12 DIAGNOSIS — Z6824 Body mass index (BMI) 24.0-24.9, adult: Secondary | ICD-10-CM | POA: Diagnosis not present

## 2023-11-14 ENCOUNTER — Other Ambulatory Visit: Payer: Self-pay

## 2023-11-14 NOTE — Telephone Encounter (Signed)
 Copied from CRM [id number]. Topic: Clinical - Medication Refill >> Nov 14, 2023  3:55 PM Darshell M wrote: Medication:  levothyroxine (SYNTHROID, LEVOTHROID) 25 MCG tablet  Has the patient contacted their pharmacy? Yes (Agent: If no, request that the patient contact the pharmacy for the refill. If patient does not wish to contact the pharmacy document the reason why and proceed with request.) (Agent: If yes, when and what did the pharmacy advise?)  This is the patient's preferred pharmacy:   [facility] - [place], [place] - [address] [facility] AT Portal to Registered [employer] Sites  Phone: [phone] Fax: [phone]   Is this the correct pharmacy for this prescription? No If no, delete pharmacy and type the correct one.   Has the prescription been filled recently? Yes  Is the patient out of the medication? No  Has the patient been seen for an appointment in the last year OR does the patient have an upcoming appointment? Yes  Can we respond through MyChart? No  Agent: Please be advised that Rx refills may take up to 3 business days. We ask that you follow-up with your pharmacy.

## 2023-11-18 MED ORDER — LEVOTHYROXINE SODIUM 50 MCG PO TABS: 50.0000 ug | ORAL_TABLET | Freq: Every day | ORAL | 1 refills | Status: DC

## 2023-11-29 ENCOUNTER — Ambulatory Visit (INDEPENDENT_AMBULATORY_CARE_PROVIDER_SITE_OTHER): Payer: Self-pay

## 2023-11-29 VITALS — BP 155/102 | HR 72 | Ht 64.0 in | Wt 147.1 lb

## 2023-11-29 DIAGNOSIS — Z23 Encounter for immunization: Secondary | ICD-10-CM

## 2023-11-29 DIAGNOSIS — E785 Hyperlipidemia, unspecified: Secondary | ICD-10-CM | POA: Insufficient documentation

## 2023-11-29 DIAGNOSIS — E039 Hypothyroidism, unspecified: Secondary | ICD-10-CM | POA: Diagnosis not present

## 2023-11-29 DIAGNOSIS — M8580 Other specified disorders of bone density and structure, unspecified site: Secondary | ICD-10-CM | POA: Insufficient documentation

## 2023-11-29 DIAGNOSIS — F411 Generalized anxiety disorder: Secondary | ICD-10-CM | POA: Diagnosis not present

## 2023-11-29 DIAGNOSIS — F419 Anxiety disorder, unspecified: Secondary | ICD-10-CM | POA: Insufficient documentation

## 2023-11-29 MED ORDER — ALPRAZOLAM 2 MG PO TABS: 2.0000 mg | ORAL_TABLET | Freq: Three times a day (TID) | ORAL | 0 refills | Status: DC | PRN

## 2023-11-29 MED ORDER — SYNTHROID 50 MCG PO TABS: 50.0000 ug | ORAL_TABLET | Freq: Every day | ORAL | 3 refills | Status: AC

## 2023-11-29 NOTE — Progress Notes (Unsigned)
 New Patient Office Visit  Subjective    Patient ID: Lori Meadows, female    DOB: 29-Jul-1950  Age: 73 y.o. MRN: [id number]  CC:  Chief Complaint  Patient presents with  . Establish Care    Pt here to establish care and for medication refills     HPI Lori Meadows presents to establish care Patient presents today to establish care.  She is a former patient of [facility], Dr. Bertell.  She has a history of hypothyroidism which is treated with name brand Synthroid.  She is also been on alprazolam 2 mg for years.  This was initially prescribed by psychiatrist in [place], and then was taken over by Dr. Bertell.  She denies any new concerns at this time.  Outpatient Encounter Medications as of 11/29/2023  Medication Sig  . aspirin 81 MG chewable tablet Chew by mouth daily.  SABRA dexlansoprazole (DEXILANT) 60 MG capsule Take 60 mg by mouth daily.  SABRA latanoprost (XALATAN) 0.005 % ophthalmic solution Place 1 drop into both eyes at bedtime.  SABRA SYNTHROID 50 MCG tablet Take 1 tablet (50 mcg total) by mouth daily before breakfast.  . [DISCONTINUED] alprazolam (XANAX) 2 MG tablet Take 2 mg by mouth at bedtime as needed.  . [DISCONTINUED] levothyroxine (SYNTHROID) 50 MCG tablet Take 1 tablet (50 mcg total) by mouth daily before breakfast.  . alprazolam (XANAX) 2 MG tablet Take 1 tablet (2 mg total) by mouth 3 (three) times daily as needed for anxiety or sleep.   No facility-administered encounter medications on file as of 11/29/2023.    Past Medical History:  Diagnosis Date  . Anxiety   . Depression   . Glaucoma   . HTN (hypertension)   . Hypothyroid   . Osteopenia   . Vitamin D deficiency     Past Surgical History:  Procedure Laterality Date  . COLONOSCOPY  2010   Dr. Donnald, no polpys, next one done in 2020  . none    . TEMPOROMANDIBULAR JOINT SURGERY     ???    Family History  Problem Relation Age of Onset  . Colon cancer Neg Hx   . Glaucoma Mother   . COPD  Father     Social History   Socioeconomic History  . Marital status: Divorced    Spouse name: Not on file  . Number of children: 3  . Years of education: Not on file  . Highest education level: Not on file  Occupational History  . Occupation: retired    Associate Professor: RETIRED    Comment: [employer]  Tobacco Use  . Smoking status: Never  . Smokeless tobacco: Not on file  Vaping Use  . Vaping status: Never Used  Substance and Sexual Activity  . Alcohol use: No  . Drug use: No  . Sexual activity: Not Currently    Birth control/protection: None  Other Topics Concern  . Not on file  Social History Narrative  . Not on file   Social Drivers of Health   Financial Resource Strain: Not on file  Food Insecurity: No Food Insecurity (11/29/2023)   Hunger Vital Sign   . Worried About Programme researcher, broadcasting/film/video in the Last Year: Never true   . Ran Out of Food in the Last Year: Never true  Transportation Needs: No Transportation Needs (11/29/2023)   PRAPARE - Transportation   . Lack of Transportation (Medical): No   . Lack of Transportation (Non-Medical): No  Physical Activity: Not  on file  Stress: Not on file  Social Connections: Not on file  Intimate Partner Violence: Not At Risk (11/29/2023)   Humiliation, Afraid, Rape, and Kick questionnaire   . Fear of Current or Ex-Partner: No   . Emotionally Abused: No   . Physically Abused: No   . Sexually Abused: No   ROS      Objective    BP (!) 155/102   Pulse 72   Ht 5' 4 (1.626 m)   Wt 147 lb 1.3 oz (66.7 kg)   SpO2 95%   BMI 25.25 kg/m   Physical Exam Vitals and nursing note reviewed.  Constitutional:      Appearance: Normal appearance.  HENT:     Head: Normocephalic.  Eyes:     Extraocular Movements: Extraocular movements intact.     Pupils: Pupils are equal, round, and reactive to light.  Cardiovascular:     Rate and Rhythm: Normal rate and regular rhythm.  Pulmonary:     Effort: Pulmonary effort is  normal.     Breath sounds: Normal breath sounds.  Musculoskeletal:     Cervical back: Normal range of motion and neck supple.  Neurological:     Mental Status: She is alert and oriented to person, place, and time.  Psychiatric:        Mood and Affect: Mood normal.        Thought Content: Thought content normal.     {Labs (Optional):23779}    Assessment & Plan:   Problem List Items Addressed This Visit       Endocrine   Hypothyroidism - Primary   Relevant Medications   SYNTHROID 50 MCG tablet     Other   Anxiety disorder   Relevant Medications   alprazolam (XANAX) 2 MG tablet   Other Visit Diagnoses       Immunization due       Relevant Orders   Flu vaccine HIGH DOSE PF(Fluzone Trivalent) (Completed)       Return in about 3 months (around 02/29/2024) for chronic follow-up with PCP.   Lori Longs, FNP

## 2023-12-04 NOTE — Assessment & Plan Note (Signed)
 She has been on this for many years.  Was prescribed by previous PCP.  Agreed to continue at previous dose.  PDMP was reviewed today.

## 2023-12-04 NOTE — Assessment & Plan Note (Signed)
 Stable with current dose of Synthroid.  She has to take brand Synthroid due to allergy from generic ingredients.  Refill provided today.  Recommend follow-up in 6 months to recheck labs.

## 2023-12-05 ENCOUNTER — Telehealth: Payer: Self-pay

## 2023-12-05 NOTE — Telephone Encounter (Signed)
 Copied from CRM [id number]. Topic: Appointments - Transfer of Care >> Nov 14, 2023  2:59 PM Darshell M wrote: Pt is requesting to transfer FROM: Lori Meadows Pt is requesting to transfer TO: Lori Meadows Reason for requested transfer: Provider retired, office closed down It is the responsibility of the team the patient would like to transfer to (Dr. Longs) to reach out to the patient if for any reason this transfer is not acceptable.

## 2023-12-20 DIAGNOSIS — H43813 Vitreous degeneration, bilateral: Secondary | ICD-10-CM | POA: Diagnosis not present

## 2023-12-20 DIAGNOSIS — H2513 Age-related nuclear cataract, bilateral: Secondary | ICD-10-CM | POA: Diagnosis not present

## 2023-12-20 DIAGNOSIS — H401131 Primary open-angle glaucoma, bilateral, mild stage: Secondary | ICD-10-CM | POA: Diagnosis not present

## 2023-12-20 DIAGNOSIS — H04123 Dry eye syndrome of bilateral lacrimal glands: Secondary | ICD-10-CM | POA: Diagnosis not present

## 2023-12-27 ENCOUNTER — Other Ambulatory Visit: Payer: Self-pay

## 2023-12-27 DIAGNOSIS — F411 Generalized anxiety disorder: Secondary | ICD-10-CM

## 2023-12-27 MED ORDER — ALPRAZOLAM 2 MG PO TABS: 2.0000 mg | ORAL_TABLET | Freq: Three times a day (TID) | ORAL | 0 refills | Status: DC | PRN

## 2023-12-27 NOTE — Telephone Encounter (Signed)
 Copied from CRM #[id number]. Topic: Clinical - Medication Refill >> Dec 27, 2023 12:52 PM Emylou G wrote: Medication: alprazolam (XANAX) 2 MG tablet    Has the patient contacted their pharmacy? No (Agent: If no, request that the patient contact the pharmacy for the refill. If patient does not wish to contact the pharmacy document the reason why and proceed with request.) (Agent: If yes, when and what did the pharmacy advise?)  This is the patient's preferred pharmacy:    [facility] [address] [place] - [address] [place] [address] Phone: [phone] Fax: [phone]  Is this the correct pharmacy for this prescription? Yes If no, delete pharmacy and type the correct one.   Has the prescription been filled recently? No  Is the patient out of the medication? Yes  Has the patient been seen for an appointment in the last year OR does the patient have an upcoming appointment? Yes  Can we respond through MyChart? NO  Agent: Please be advised that Rx refills may take up to 3 business days. We ask that you follow-up with your pharmacy.

## 2024-01-08 DIAGNOSIS — D225 Melanocytic nevi of trunk: Secondary | ICD-10-CM | POA: Diagnosis not present

## 2024-01-08 DIAGNOSIS — Z85828 Personal history of other malignant neoplasm of skin: Secondary | ICD-10-CM | POA: Diagnosis not present

## 2024-01-08 DIAGNOSIS — D2271 Melanocytic nevi of right lower limb, including hip: Secondary | ICD-10-CM | POA: Diagnosis not present

## 2024-01-08 DIAGNOSIS — L820 Inflamed seborrheic keratosis: Secondary | ICD-10-CM | POA: Diagnosis not present

## 2024-01-08 DIAGNOSIS — L821 Other seborrheic keratosis: Secondary | ICD-10-CM | POA: Diagnosis not present

## 2024-01-22 DIAGNOSIS — H2513 Age-related nuclear cataract, bilateral: Secondary | ICD-10-CM | POA: Diagnosis not present

## 2024-01-22 DIAGNOSIS — H04123 Dry eye syndrome of bilateral lacrimal glands: Secondary | ICD-10-CM | POA: Diagnosis not present

## 2024-01-22 DIAGNOSIS — H401131 Primary open-angle glaucoma, bilateral, mild stage: Secondary | ICD-10-CM | POA: Diagnosis not present

## 2024-01-27 ENCOUNTER — Other Ambulatory Visit: Payer: Self-pay

## 2024-01-27 DIAGNOSIS — F411 Generalized anxiety disorder: Secondary | ICD-10-CM

## 2024-01-27 MED ORDER — ALPRAZOLAM 2 MG PO TABS: 2.0000 mg | ORAL_TABLET | Freq: Three times a day (TID) | ORAL | 0 refills | Status: DC | PRN

## 2024-01-27 NOTE — Telephone Encounter (Signed)
 Copied from CRM #[id number]. Topic: Clinical - Medication Refill >> Jan 27, 2024  1:50 PM Wess S wrote: Medication: alprazolam  (XANAX ) 2 MG tablet  Has the patient contacted their pharmacy? No (Agent: If no, request that the patient contact the pharmacy for the refill. If patient does not wish to contact the pharmacy document the reason why and proceed with request.) (Agent: If yes, when and what did the pharmacy advise?)  This is the patient's preferred pharmacy:   [facility] [address], [place] - [address] [place] [address] Phone: [phone] Fax: [phone]  Is this the correct pharmacy for this prescription? Yes If no, delete pharmacy and type the correct one.   Has the prescription been filled recently? Yes  Is the patient out of the medication? No  Has the patient been seen for an appointment in the last year OR does the patient have an upcoming appointment? Yes  Can we respond through MyChart? No  Agent: Please be advised that Rx refills may take up to 3 business days. We ask that you follow-up with your pharmacy.

## 2024-03-02 ENCOUNTER — Other Ambulatory Visit: Payer: Self-pay

## 2024-03-02 DIAGNOSIS — F411 Generalized anxiety disorder: Secondary | ICD-10-CM

## 2024-03-17 ENCOUNTER — Ambulatory Visit

## 2024-03-20 ENCOUNTER — Ambulatory Visit: Payer: Self-pay

## 2024-04-17 ENCOUNTER — Ambulatory Visit
